# Patient Record
Sex: Male | Born: 1980 | Race: White | Hispanic: No | Marital: Married | Smoking: Never smoker
Health system: Southern US, Community
[De-identification: ages and names within clinical notes are randomized; demographics above are authoritative.]

## PROBLEM LIST (undated history)

## (undated) DIAGNOSIS — E119 Type 2 diabetes mellitus without complications: Secondary | ICD-10-CM

---

## 2008-05-08 ENCOUNTER — Encounter: Admission: RE | Admit: 2008-05-08 | Discharge: 2008-05-08 | Payer: Self-pay | Admitting: Unknown Physician Specialty

## 2008-05-09 ENCOUNTER — Emergency Department (HOSPITAL_COMMUNITY): Admission: EM | Admit: 2008-05-09 | Discharge: 2008-05-09 | Payer: Self-pay | Admitting: Emergency Medicine

## 2009-12-24 IMAGING — CT CT PELVIS W/ CM
2 of 5 series · 17 of 46 positions shown, 19 images · IV contrast (READICAT/WATER & OMNI 300/[ID])
Comparison: None

CT ABDOMEN

CLINICAL DATA: Left upper quadrant pain for several months, nausea
and diarrhea

CT ABDOMEN AND PELVIS WITH CONTRAST
TECHNIQUE: Multidetector CT imaging of the abdomen and pelvis was
performed using the standard protocol following bolus
administration of intravenous contrast.
Contrast: 100 ml of Vmnipaque-RAA

[Series 2: abdomen w/ · axial · 0.70mm/px · z∈[-471,-106]mm · 14 of 83 slices shown, 16 images]
[im 5/83  soft-tissue]
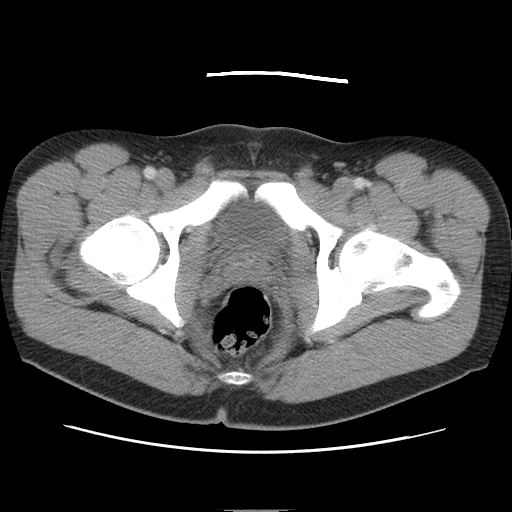
[im 5/83  bone]
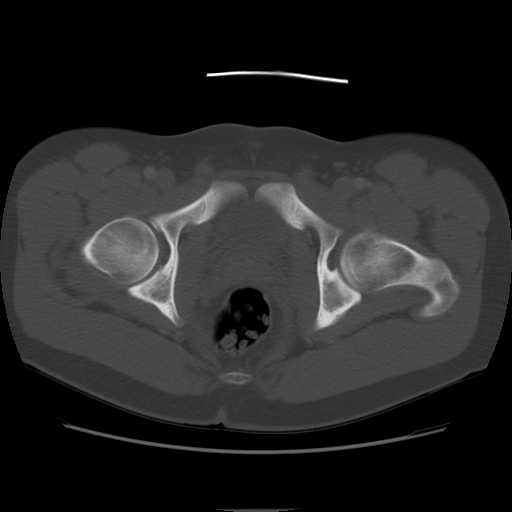
[im 10/83  soft-tissue]
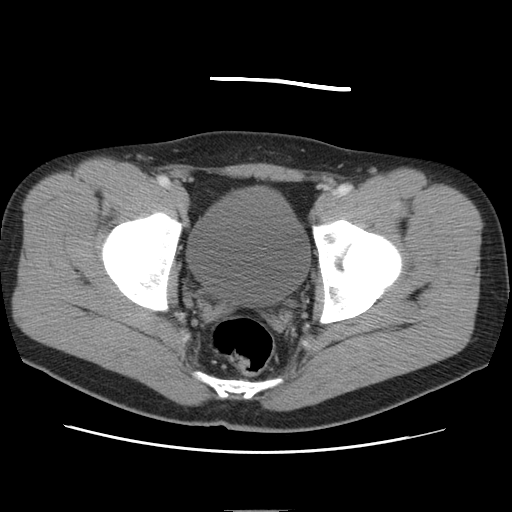
[im 19/83  soft-tissue]
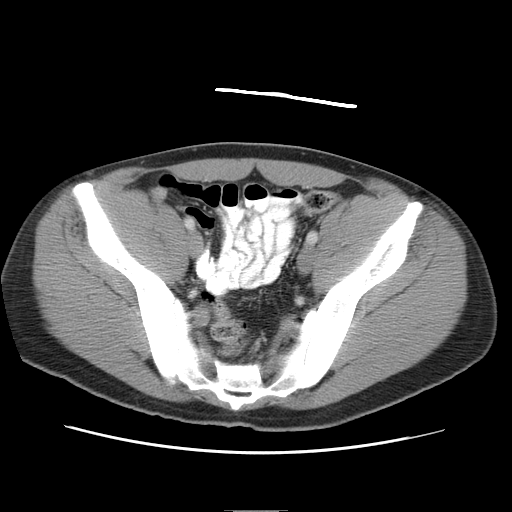
[im 23/83  soft-tissue]
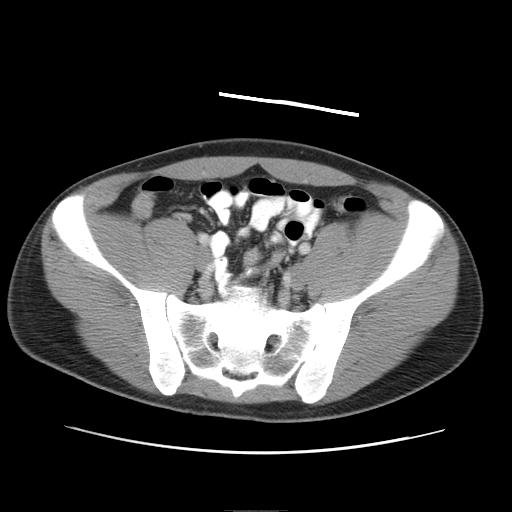
[im 28/83  soft-tissue]
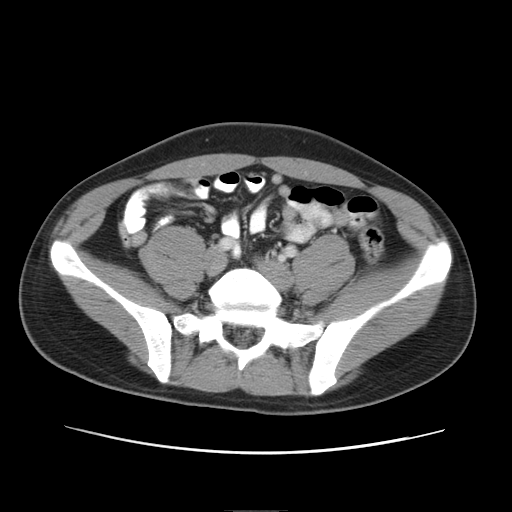
[im 32/83  soft-tissue]
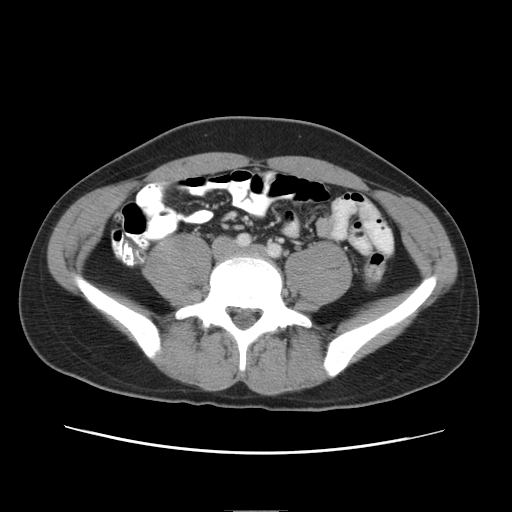
[im 37/83  soft-tissue]
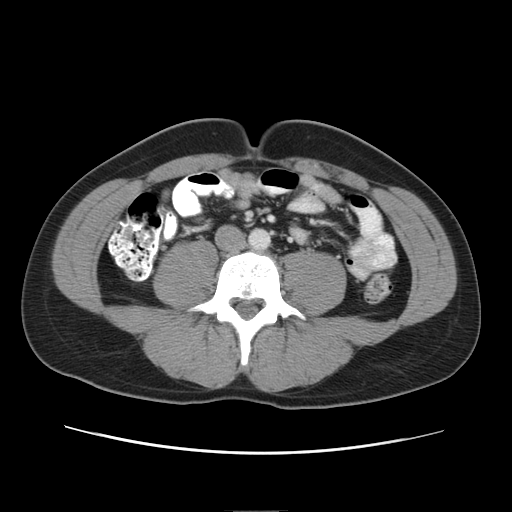
[im 46/83  soft-tissue]
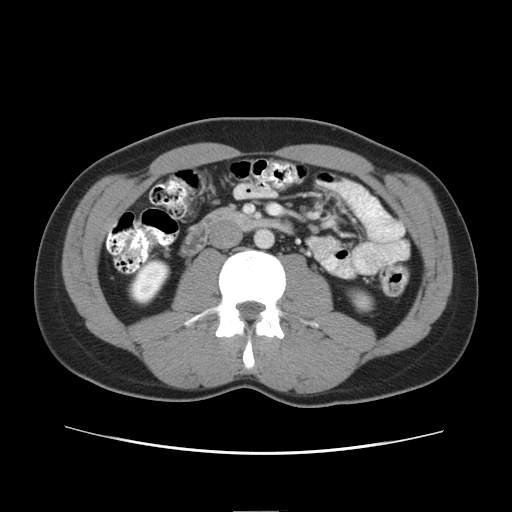
[im 51/83  soft-tissue]
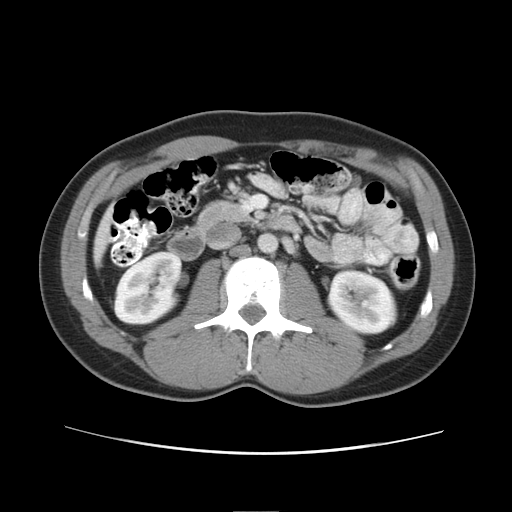
[im 51/83  bone]
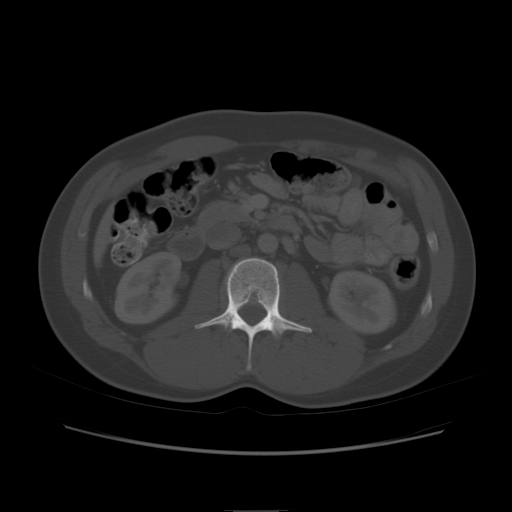
[im 55/83  soft-tissue]
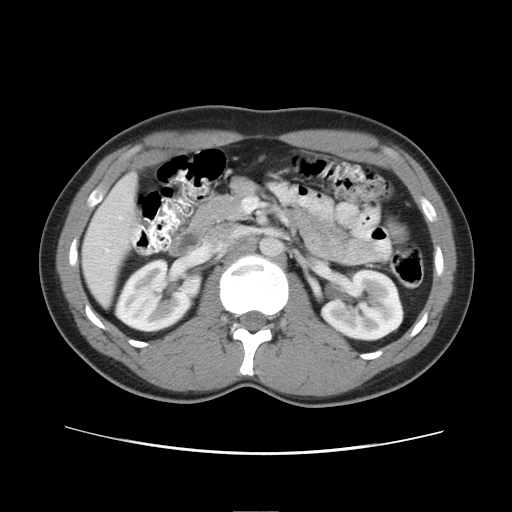
[im 60/83  soft-tissue]
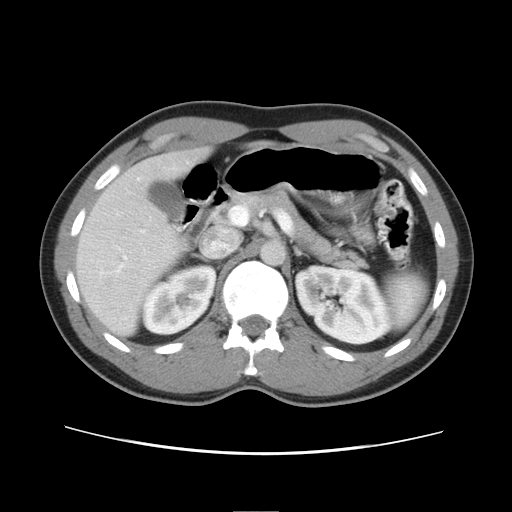
[im 64/83  soft-tissue]
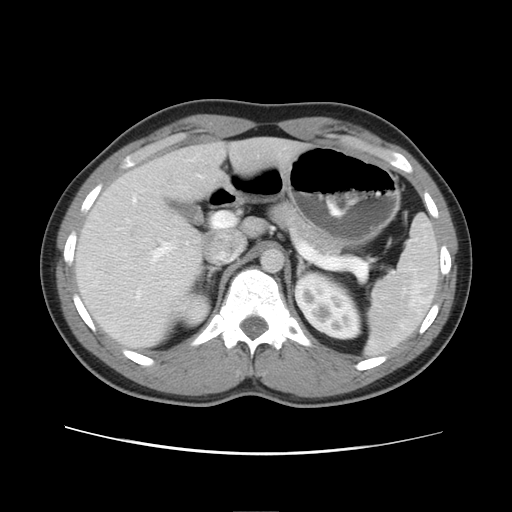
[im 73/83  soft-tissue]
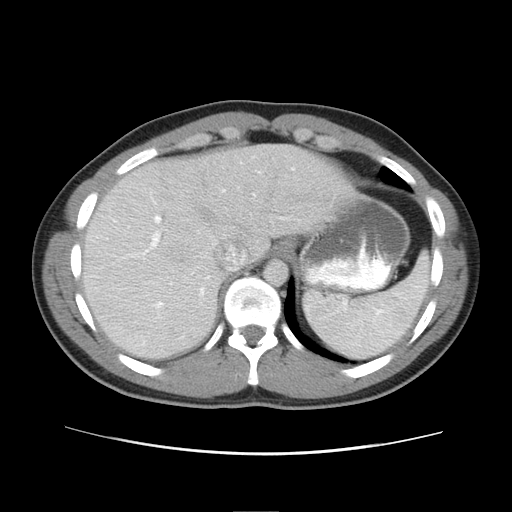
[im 78/83  soft-tissue]
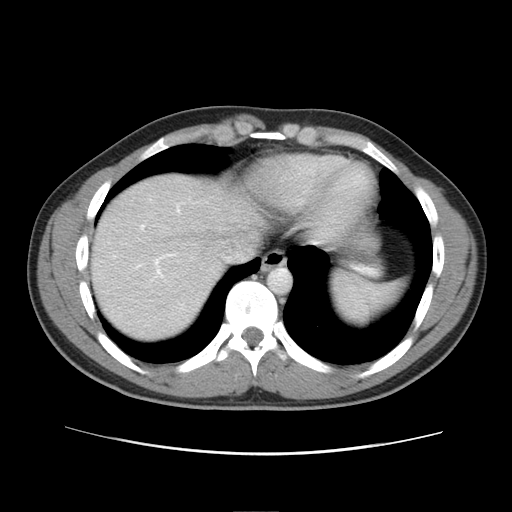

[Series 401: cor · coronal · 0.91mm/px · 3 of 102 slices shown]
[im 34/102  soft-tissue]
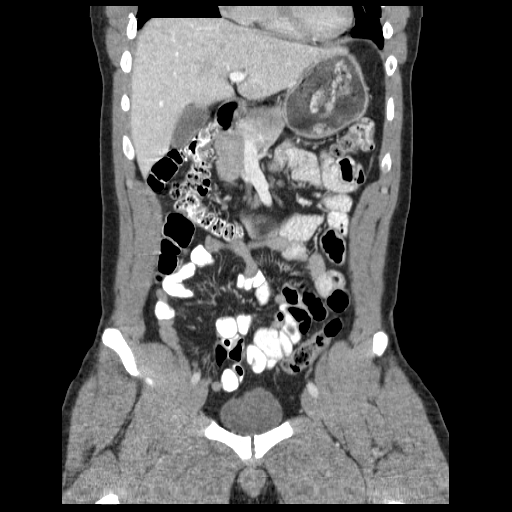
[im 45/102  soft-tissue]
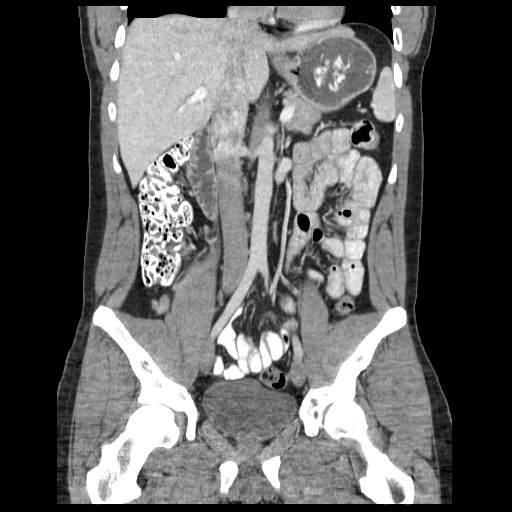
[im 57/102  soft-tissue]
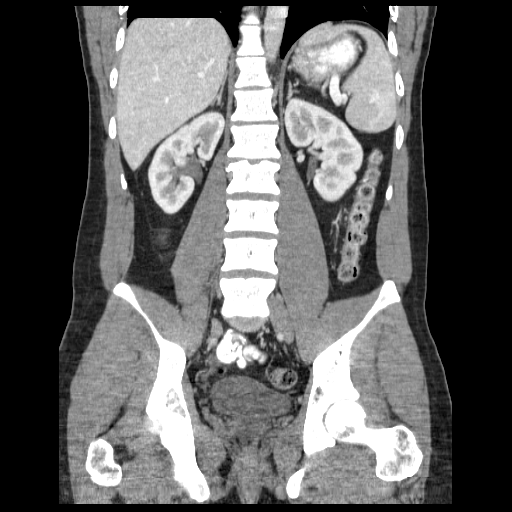

[17 of 46 positions shown; findings below may reference images not displayed]

FINDINGS: The lung bases are clear.  The liver enhances with no
focal abnormality and no ductal dilatation is seen.  No calcified
gallstones are noted.  The pancreas is normal in size and the
pancreatic duct is not dilated.  The adrenal glands and spleen
appear normal.  The kidneys enhance and on delayed images the
pelvocaliceal systems appear normal.  The abdominal aorta is normal
in caliber.  There are is somewhat prominent nodes within the right
lower quadrant, with one of the larger nodes measuring 9 x 11 mm.
Mesenteric adenitis is a consideration.
IMPRESSION: Somewhat prominent nodes in the right lower quadrant may indicate
mesenteric adenitis.

CT PELVIS
FINDINGS: The appendix is well seen and appears normal.  There is
no CT evidence of appendicitis.  The terminal ileum is well
visualized and appears normal.  The urinary bladder is
unremarkable.  Prostate is normal in size.  No pelvic mass or
adenopathy is seen within the pelvis.  No bony abnormalities noted.
IMPRESSION: The appendix and terminal ileum appear normal.

## 2013-04-21 ENCOUNTER — Encounter (HOSPITAL_BASED_OUTPATIENT_CLINIC_OR_DEPARTMENT_OTHER): Payer: Self-pay | Admitting: Emergency Medicine

## 2013-04-21 ENCOUNTER — Emergency Department (HOSPITAL_BASED_OUTPATIENT_CLINIC_OR_DEPARTMENT_OTHER)
Admission: EM | Admit: 2013-04-21 | Discharge: 2013-04-22 | Disposition: A | Discharge status: Home / Self Care | Payer: BC Managed Care – PPO | Attending: Emergency Medicine | Admitting: Emergency Medicine

## 2013-04-21 DIAGNOSIS — Z79899 Other long term (current) drug therapy: Secondary | ICD-10-CM | POA: Insufficient documentation

## 2013-04-21 DIAGNOSIS — R634 Abnormal weight loss: Secondary | ICD-10-CM | POA: Insufficient documentation

## 2013-04-21 DIAGNOSIS — R739 Hyperglycemia, unspecified: Secondary | ICD-10-CM

## 2013-04-21 DIAGNOSIS — E119 Type 2 diabetes mellitus without complications: Secondary | ICD-10-CM | POA: Insufficient documentation

## 2013-04-21 DIAGNOSIS — R110 Nausea: Secondary | ICD-10-CM | POA: Insufficient documentation

## 2013-04-21 HISTORY — DX: Type 2 diabetes mellitus without complications: E11.9

## 2013-04-21 LAB — URINALYSIS, ROUTINE W REFLEX MICROSCOPIC
Bilirubin Urine: NEGATIVE
Glucose, UA: >1000 mg/dL — AB
Hgb urine dipstick: NEGATIVE
Ketones, ur: 40 mg/dL — AB
Leukocytes, UA: NEGATIVE
Nitrite: NEGATIVE
Protein, ur: NEGATIVE mg/dL
Specific Gravity, Urine: 1.044 — ABNORMAL HIGH (ref 1.005–1.030)
Urobilinogen, UA: 0.2 mg/dL (ref 0.0–1.0)
pH: 5.5 (ref 5.0–8.0)

## 2013-04-21 LAB — GLUCOSE, CAPILLARY
Glucose-Capillary: 596 mg/dL (ref 70–99)
Glucose-Capillary: 562 mg/dL (ref 70–99)
Glucose-Capillary: 367 mg/dL — ABNORMAL HIGH (ref 70–99)

## 2013-04-21 LAB — CBC WITH DIFFERENTIAL/PLATELET
Basophils Absolute: 0 10*3/uL (ref 0.0–0.1)
Basophils Relative: 0 % (ref 0–1)
Eosinophils Absolute: 0.2 10*3/uL (ref 0.0–0.7)
Eosinophils Relative: 3 % (ref 0–5)
HCT: 46.4 % (ref 39.0–52.0)
Hemoglobin: 16.4 g/dL (ref 13.0–17.0)
Lymphocytes Relative: 19 % (ref 12–46)
Lymphs Abs: 1.5 10*3/uL (ref 0.7–4.0)
MCH: 31.8 pg (ref 26.0–34.0)
MCHC: 35.3 g/dL (ref 30.0–36.0)
MCV: 90.1 fL (ref 78.0–100.0)
Monocytes Absolute: 0.8 10*3/uL (ref 0.1–1.0)
Monocytes Relative: 10 % (ref 3–12)
Neutro Abs: 5.3 10*3/uL (ref 1.7–7.7)
Neutrophils Relative %: 68 % (ref 43–77)
Platelets: 177 10*3/uL (ref 150–400)
RBC: 5.15 MIL/uL (ref 4.22–5.81)
RDW: 12.1 % (ref 11.5–15.5)
WBC: 7.8 10*3/uL (ref 4.0–10.5)

## 2013-04-21 LAB — COMPREHENSIVE METABOLIC PANEL
ALT: 22 U/L (ref 0–53)
AST: 21 U/L (ref 0–37)
Albumin: 5.2 g/dL (ref 3.5–5.2)
Alkaline Phosphatase: 124 U/L — ABNORMAL HIGH (ref 39–117)
BUN: 19 mg/dL (ref 6–23)
CO2: 24 mEq/L (ref 19–32)
Calcium: 9.8 mg/dL (ref 8.4–10.5)
Chloride: 89 mEq/L — ABNORMAL LOW (ref 96–112)
Creatinine, Ser: 0.9 mg/dL (ref 0.50–1.35)
GFR calc Af Amer: >90 mL/min (ref >90)
GFR calc non Af Amer: >90 mL/min (ref >90)
Glucose, Bld: 674 mg/dL (ref 70–99)
Potassium: 4.5 mEq/L (ref 3.7–5.3)
Sodium: 133 mEq/L — ABNORMAL LOW (ref 137–147)
Total Bilirubin: 1.1 mg/dL (ref 0.3–1.2)
Total Protein: 7.8 g/dL (ref 6.0–8.3)

## 2013-04-21 LAB — URINE MICROSCOPIC-ADD ON

## 2013-04-21 MED ORDER — DEXTROSE-NACL 5-0.45 % IV SOLN: INTRAVENOUS | Status: DC

## 2013-04-21 MED ORDER — SODIUM CHLORIDE 0.9 % IV SOLN: INTRAVENOUS | Status: DC

## 2013-04-21 MED ORDER — SODIUM CHLORIDE 0.9 % IV SOLN
INTRAVENOUS | Status: DC
  Administered 2013-04-21: 22:00:00 via INTRAVENOUS

## 2013-04-21 MED ORDER — INSULIN ASPART PROT & ASPART (70-30 MIX) 100 UNIT/ML ~~LOC~~ SUSP: 15.0000 [IU] | Freq: Once | SUBCUTANEOUS | Status: DC

## 2013-04-21 MED ORDER — DEXTROSE 50 % IV SOLN: 25.0000 mL | INTRAVENOUS | Status: DC | PRN

## 2013-04-21 MED ORDER — INSULIN ASPART 100 UNIT/ML ~~LOC~~ SOLN
15.0000 [IU] | Freq: Once | SUBCUTANEOUS | Status: AC
  Administered 2013-04-21: 15 [IU] via SUBCUTANEOUS

## 2013-04-21 MED ORDER — SODIUM CHLORIDE 0.9 % IV BOLUS (SEPSIS)
1000.0000 mL | Freq: Once | INTRAVENOUS | Status: AC
  Administered 2013-04-21: 1000 mL via INTRAVENOUS

## 2013-04-21 MED ORDER — INSULIN REGULAR BOLUS VIA INFUSION
0.0000 [IU] | Freq: Three times a day (TID) | INTRAVENOUS | Status: DC
Start: 2013-04-22 — End: 2013-04-21
  Filled 2013-04-21: qty 10

## 2013-04-21 MED ORDER — INSULIN ASPART 100 UNIT/ML ~~LOC~~ SOLN
15.0000 [IU] | Freq: Once | SUBCUTANEOUS | Status: DC
  Filled 2013-04-21: qty 1

## 2013-04-21 NOTE — ED Provider Notes (Signed)
CSN: [id number]     Arrival date & time 04/21/13  1954 History  This chart was scribed for [name], MD by [name], ED Scribe. This patient was seen in room MH08/MH08 and the patient's care was started 9:02 PM.   Chief Complaint  Patient presents with  . Hyperglycemia    The history is provided by the patient. No language interpreter was used.     HPI Comments: Kenneth Ellison is a 33 y.o. male who presents to the Emergency Department complaining of hyperglycemia that began 5 weeks ago. He reports being diagnosed with DM about 5 weeks ago and was started on Metformin 1000 mg at that time. He reports having blurry vision and polyuria prior to being diagnosed. He states the blurry vision resolved after starting the Metformin. Pt states he has been compliant with the medication and he has been checking his blood glucose levels every morning but the blood sugar levels have remained elevated. He reports having unexpected weight loss and he has lost about 30 lbs since being diagnosed. He reports having polydipsia and polyuria along with nausea from taking the medication.   PCP [facility] in [place] Past Medical History  Diagnosis Date  . Diabetes mellitus without complication    History reviewed. No pertinent past surgical history. No family history on file. History  Substance Use Topics  . Smoking status: Never Smoker   . Smokeless tobacco: Former Neurosurgeon  . Alcohol Use: No    Review of Systems  Constitutional: Positive for unexpected weight change.  HENT: Negative.   Respiratory: Negative.   Cardiovascular: Negative.   Gastrointestinal: Positive for nausea.  Endocrine: Positive for polydipsia and polyuria.  Musculoskeletal: Negative.   Skin: Negative.   Neurological: Negative.   Psychiatric/Behavioral: Negative.     Allergies  Sulfa antibiotics  Home Medications   Current Outpatient Rx  Name  Route  Sig  Dispense  Refill  . metFORMIN (GLUCOPHAGE) 1000 MG  tablet   Oral   Take 500 mg by mouth 2 (two) times daily with a meal.          BP 148/85  Pulse 109  Temp(Src) 97.9 F (36.6 C) (Oral)  Resp 18  SpO2 100% Physical Exam  Nursing note and vitals reviewed. Constitutional: He appears well-developed and well-nourished.  HENT:  Head: Normocephalic and atraumatic.  Eyes: Conjunctivae are normal. Pupils are equal, round, and reactive to light.  Neck: Neck supple. No tracheal deviation present. No thyromegaly present.  Cardiovascular: Normal rate and regular rhythm.   No murmur heard. Pulmonary/Chest: Effort normal and breath sounds normal.  Abdominal: Soft. Bowel sounds are normal. He exhibits no distension. There is no tenderness.  Musculoskeletal: Normal range of motion. He exhibits no edema and no tenderness.  Neurological: He is alert. Coordination normal.  Skin: Skin is warm and dry. No rash noted.  Psychiatric: He has a normal mood and affect.    ED Course  Procedures (including critical care time)  DIAGNOSTIC STUDIES: Oxygen Saturation is 100% on RA, normal by my interpretation.    COORDINATION OF CARE: 9:26 PM Discussed treatment plan with pt at bedside and pt agreed to plan.   Labs Review Labs Reviewed  GLUCOSE, CAPILLARY - Abnormal; Notable for the following:    Glucose-Capillary 596 (*)    All other components within normal limits  URINALYSIS, ROUTINE W REFLEX MICROSCOPIC - Abnormal; Notable for the following:    Specific Gravity, Urine 1.044 (*)    Glucose, UA >1000 (*)  Ketones, ur 40 (*)    All other components within normal limits  CBC WITH DIFFERENTIAL  URINE MICROSCOPIC-ADD ON  COMPREHENSIVE METABOLIC PANEL   Imaging Review No results found.  EKG Interpretation   None        Results for orders placed during the hospital encounter of 04/21/13  GLUCOSE, CAPILLARY      Result Value Range   Glucose-Capillary 596 (*) 70 - 99 mg/dL   Comment 1 Notify RN    URINALYSIS, ROUTINE W REFLEX  MICROSCOPIC      Result Value Range   Color, Urine YELLOW  YELLOW   APPearance CLEAR  CLEAR   Specific Gravity, Urine 1.044 (*) 1.005 - 1.030   pH 5.5  5.0 - 8.0   Glucose, UA >1000 (*) NEGATIVE mg/dL   Hgb urine dipstick NEGATIVE  NEGATIVE   Bilirubin Urine NEGATIVE  NEGATIVE   Ketones, ur 40 (*) NEGATIVE mg/dL   Protein, ur NEGATIVE  NEGATIVE mg/dL   Urobilinogen, UA 0.2  0.0 - 1.0 mg/dL   Nitrite NEGATIVE  NEGATIVE   Leukocytes, UA NEGATIVE  NEGATIVE  CBC WITH DIFFERENTIAL      Result Value Range   WBC 7.8  4.0 - 10.5 K/uL   RBC 5.15  4.22 - 5.81 MIL/uL   Hemoglobin 16.4  13.0 - 17.0 g/dL   HCT 91.4  78.2 - 95.6 %   MCV 90.1  78.0 - 100.0 fL   MCH 31.8  26.0 - 34.0 pg   MCHC 35.3  30.0 - 36.0 g/dL   RDW 21.3  08.6 - 57.8 %   Platelets 177  150 - 400 K/uL   Neutrophils Relative % 68  43 - 77 %   Neutro Abs 5.3  1.7 - 7.7 K/uL   Lymphocytes Relative 19  12 - 46 %   Lymphs Abs 1.5  0.7 - 4.0 K/uL   Monocytes Relative 10  3 - 12 %   Monocytes Absolute 0.8  0.1 - 1.0 K/uL   Eosinophils Relative 3  0 - 5 %   Eosinophils Absolute 0.2  0.0 - 0.7 K/uL   Basophils Relative 0  0 - 1 %   Basophils Absolute 0.0  0.0 - 0.1 K/uL  COMPREHENSIVE METABOLIC PANEL      Result Value Range   Sodium 133 (*) 137 - 147 mEq/L   Potassium 4.5  3.7 - 5.3 mEq/L   Chloride 89 (*) 96 - 112 mEq/L   CO2 24  19 - 32 mEq/L   Glucose, Bld 674 (*) 70 - 99 mg/dL   BUN 19  6 - 23 mg/dL   Creatinine, Ser 4.69  0.50 - 1.35 mg/dL   Calcium 9.8  8.4 - 62.9 mg/dL   Total Protein 7.8  6.0 - 8.3 g/dL   Albumin 5.2  3.5 - 5.2 g/dL   AST 21  0 - 37 U/L   ALT 22  0 - 53 U/L   Alkaline Phosphatase 124 (*) 39 - 117 U/L   Total Bilirubin 1.1  0.3 - 1.2 mg/dL   GFR calc non Af Amer >90  >90 mL/min   GFR calc Af Amer >90  >90 mL/min  URINE MICROSCOPIC-ADD ON      Result Value Range   Bacteria, UA RARE  RARE  GLUCOSE, CAPILLARY      Result Value Range   Glucose-Capillary 562 (*) 70 - 99 mg/dL   Comment 1  Notify RN    GLUCOSE, CAPILLARY  Result Value Range   Glucose-Capillary 367 (*) 70 - 99 mg/dL  GLUCOSE, CAPILLARY      Result Value Range   Glucose-Capillary 255 (*) 70 - 99 mg/dL   No results found.  12:25 AM feels improved after treatment with intravenous fluids and subcutaneous insulin. He feels very home MDM  No diagnosis found. I spoke with Dr.Swaim plan prescription Lantus insulin 10 units daily starting later today . Continue metformin . An appointment has been scheduled for him to see Dr.Sun on Monday , 04/23/2013. Diagnosis hyperglycemia      Doug SouSam Leora Platt, MD 04/22/13 [phone]

## 2013-04-21 NOTE — ED Notes (Signed)
Pt states his primary care doctor has not done a complete work up on his new diagnosis = pt unable to follow up with endocrinologist due to need for insurance referrals.

## 2013-04-21 NOTE — ED Notes (Signed)
Per lab Glucose 674-EDP notified.

## 2013-04-21 NOTE — ED Notes (Signed)
I gave urine sample to lab for holding until order prints.

## 2013-04-21 NOTE — ED Notes (Signed)
Vital obtained by EMT Onalee Huaavid

## 2013-04-21 NOTE — ED Notes (Signed)
Dx with diabetes 5 weeks ago and was started on metformin- states sugars have been elevated- CBG 596 in triage

## 2013-04-22 LAB — GLUCOSE, CAPILLARY: Glucose-Capillary: 255 mg/dL — ABNORMAL HIGH (ref 70–99)

## 2013-04-22 MED ORDER — INSULIN GLARGINE 100 UNIT/ML ~~LOC~~ SOLN: 10.0000 [IU] | Freq: Every day | SUBCUTANEOUS | Status: DC

## 2013-04-22 NOTE — Discharge Instructions (Signed)
High Blood Sugar An appointment has been scheduled for you to see Dr. Wynelle LinkSun Monday 04/23/13 at 1145 am.  Take the insulin at the same time each day. Continue to take metformin as prescribed. Check your blood sugar when you get home from here. If your blood sugar is less than 90, eat something sweet and recheck it after 30 minutes. If he can't get your blood sugar above 110, return here immediately  High blood sugar (hyperglycemia) means that the level of sugar in your blood is higher than it should be. Signs of high blood sugar include:  Feeling thirsty.  Frequent peeing (urinating).  Feeling tired or sleepy.  Dry mouth.  Vision changes.  Feeling weak.  Feeling hungry but losing weight.  Numbness and tingling in your hands or feet.  Headache. When you ignore these signs, your blood sugar may keep going up. These problems may get worse, and other problems may begin. HOME CARE  Check your blood sugars as told by your doctor. Write down the numbers with the date and time.  Take the right amount of insulin or diabetes pills at the right time. Write down the dose with date and time.  Refill your insulin or diabetes pills before running out.  Watch what you eat. Follow your meal plan.  Drink liquids without sugar, such as water. Check with your doctor if you have kidney or heart disease.  Follow your doctor's orders for exercise. Exercise at the same time of day.  Keep your doctor's appointments. GET HELP RIGHT AWAY IF:   You have trouble thinking or are confused.  You have fast breathing with fruity smelling breath.  You pass out (faint).  You have 2 to 3 days of high blood sugars and you do not know why.  You have chest pain.  You are feeling sick to your stomach (nauseous) or throwing up (vomiting).  You have sudden vision changes. MAKE SURE YOU:   Understand these instructions.  Will watch your condition.  Will get help right away if you are not doing well or get  worse. Document Released: 01/31/2009 Document Revised: 06/28/2011 Document Reviewed: 01/31/2009 [facility] Patient Information 2014 [place], [place].

## 2013-05-16 ENCOUNTER — Encounter: Payer: BC Managed Care – PPO | Attending: Family Medicine

## 2013-05-16 VITALS — Ht 67.0 in | Wt 144.2 lb

## 2013-05-16 DIAGNOSIS — Z713 Dietary counseling and surveillance: Secondary | ICD-10-CM | POA: Insufficient documentation

## 2013-05-16 DIAGNOSIS — E119 Type 2 diabetes mellitus without complications: Secondary | ICD-10-CM | POA: Insufficient documentation

## 2013-05-17 NOTE — Progress Notes (Signed)
Patient was seen on 05/17/2013 for the first of a series of three diabetes self-management courses at the [facility].  Current HbA1c: 9.7% on 03/19/13  The following learning objectives were met by the patient during this class:  Describe diabetes  State some common risk factors for diabetes  Defines the role of glucose and insulin  Identifies type of diabetes and pathophysiology  Describe the relationship between diabetes and cardiovascular risk  State the members of the Healthcare Team  States the rationale for glucose monitoring  State when to test glucose  State their individual Target Range  State the importance of logging glucose readings  Describe how to interpret glucose readings  Identifies A1C target  Explain the correlation between A1c and eAG values  State symptoms and treatment of high blood glucose  State symptoms and treatment of low blood glucose  Explain proper technique for glucose testing  Identifies proper sharps disposal  Handouts given during class include:  Living Well with Diabetes book  Carb Counting and Meal Planning book  Meal Plan Card  Carbohydrate guide  Meal planning worksheet  Low Sodium Flavoring Tips  The diabetes portion plate  P8G to eAG Conversion Chart  Diabetes Medications  Diabetes Recommended Care Schedule  Support Group  Diabetes Success Plan  Core Class Satisfaction Survey  Follow-Up Plan:  Attend core 2

## 2013-06-13 ENCOUNTER — Encounter: Payer: BC Managed Care – PPO | Attending: Family Medicine

## 2013-06-13 DIAGNOSIS — E119 Type 2 diabetes mellitus without complications: Secondary | ICD-10-CM | POA: Insufficient documentation

## 2013-06-13 DIAGNOSIS — Z713 Dietary counseling and surveillance: Secondary | ICD-10-CM | POA: Insufficient documentation

## 2013-06-13 NOTE — Progress Notes (Signed)
Patient was seen on 06/13/2013 for the second of a series of three diabetes self-management courses at the [facility]. The following learning objectives were met by the patient during this class:   Describe the role of different macronutrients on glucose  Explain how carbohydrates affect blood glucose  State what foods contain the most carbohydrates  Demonstrate carbohydrate counting  Demonstrate how to read Nutrition Facts food label  Describe effects of various fats on heart health  Describe the importance of good nutrition for health and healthy eating strategies  Describe techniques for managing your shopping, cooking and meal planning  List strategies to follow meal plan when dining out  Describe the effects of alcohol on glucose and how to use it safely  Goals:  Follow Diabetes Meal Plan as instructed  Eat 3 meals and 2 snacks, every 3-5 hrs  Limit carbohydrate intake to 60-75 grams carbohydrate/meal Limit carbohydrate intake to 15-30 grams carbohydrate/snack Add lean protein foods to meals/snacks  Monitor glucose levels as instructed by your doctor   Follow-Up Plan:  Attend Core 3  Work towards following your personal food plan.

## 2013-06-20 ENCOUNTER — Ambulatory Visit: Payer: BC Managed Care – PPO

## 2013-06-25 ENCOUNTER — Encounter: Payer: BC Managed Care – PPO | Attending: Family Medicine | Admitting: *Deleted

## 2013-06-25 ENCOUNTER — Encounter: Payer: Self-pay | Admitting: *Deleted

## 2013-06-25 VITALS — Ht 67.0 in | Wt 150.6 lb

## 2013-06-25 DIAGNOSIS — Z713 Dietary counseling and surveillance: Secondary | ICD-10-CM | POA: Insufficient documentation

## 2013-06-25 DIAGNOSIS — E119 Type 2 diabetes mellitus without complications: Secondary | ICD-10-CM | POA: Insufficient documentation

## 2013-06-25 DIAGNOSIS — IMO0002 Reserved for concepts with insufficient information to code with codable children: Secondary | ICD-10-CM

## 2013-06-25 DIAGNOSIS — E1065 Type 1 diabetes mellitus with hyperglycemia: Secondary | ICD-10-CM

## 2013-06-25 NOTE — Progress Notes (Signed)
Appt start time: 1045 end time:  1215.  Assessment:  Patient was seen on  06/25/13 for individual diabetes education. This patient was diagnosed with Type 2 Diabetes in December, 2014. He did not respond to Metformin and has since been diagnosed with Type 1 Diabetes, taking basal bolus insulin with Lantus and Humalog. He expresses excellent understanding of Carb Counting, factors that affect BG including food, activity, insulin and stress. He is also adjusting his bolus insulin based on Carb Ratio of 1/15 grams, and is using Correction Factor of 1/50 mg/dl above target of 161100. 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 He expresses understanding of signs, causes and treatment options for hypoglycemia. He states some interest in CGM and eventually perhaps an insulin pump. Would like more information to enable him to make informed choice about this.  Current HbA1c: 9.7%  Preferred Learning Style:   No preference indicated   Learning Readiness:   Ready  Change in progress  MEDICATIONS: see list. Diabetes medications are Lantus and Humalog  DIETARY INTAKE: not discussed at this visit. Patient attended Core 1 and Core 2 of Diabetes Classes and expresses very good understanding of nutrition recommendations for diabetes and Carb Counting.  Usual physical activity: physically active with his lawn business     Intervention:    Commended him on his excellent understanding of his diabetes and all of the management details.  Taught difference between delivery of insulin via syringe/pen compared to insulin pump.  Demonstrated improved insulin delivery via pump due to improved accuracy of dose and flexibility of adjusting bolus insulin based on carb intake and BG correction.  Demonstrated pump, insulin reservoir and infusion set options, and button pushing for bolus delivery of insulin through the pump  Explained importance of testing BG at least 4 times per day for appropriate  correction of high BG and prevention of DKA as applicable.  Provided him the opportunity to insert a Mio infusion set which he did with no problem  Teaching Method Utilized: Visual, Auditory and Hands on  Handouts given during visit include:  Intensive Insulin Handout  Intro to Pumping handout  Barriers to learning/adherence to lifestyle change: none  Diabetes self-care support plan:   [facility] Type 1 support group flyer provided  Demonstrated degree of understanding via:  Teach Back   Monitoring/Evaluation:  Dietary intake, exercise, SMBG, insulin adjustment, and body weight prn. Patient to call as needed in the future.

## 2015-10-14 DIAGNOSIS — Z794 Long term (current) use of insulin: Secondary | ICD-10-CM | POA: Diagnosis not present

## 2015-10-14 DIAGNOSIS — E109 Type 1 diabetes mellitus without complications: Secondary | ICD-10-CM | POA: Diagnosis not present

## 2016-01-28 DIAGNOSIS — E109 Type 1 diabetes mellitus without complications: Secondary | ICD-10-CM | POA: Diagnosis not present

## 2016-01-28 DIAGNOSIS — Z794 Long term (current) use of insulin: Secondary | ICD-10-CM | POA: Diagnosis not present

## 2016-02-25 DIAGNOSIS — E109 Type 1 diabetes mellitus without complications: Secondary | ICD-10-CM | POA: Diagnosis not present

## 2016-03-02 LAB — HM DIABETES EYE EXAM

## 2016-03-29 DIAGNOSIS — J019 Acute sinusitis, unspecified: Secondary | ICD-10-CM | POA: Diagnosis not present

## 2016-04-22 ENCOUNTER — Ambulatory Visit (INDEPENDENT_AMBULATORY_CARE_PROVIDER_SITE_OTHER): Payer: BLUE CROSS/BLUE SHIELD | Admitting: Podiatry

## 2016-04-22 ENCOUNTER — Encounter: Payer: Self-pay | Admitting: Podiatry

## 2016-04-22 ENCOUNTER — Ambulatory Visit (INDEPENDENT_AMBULATORY_CARE_PROVIDER_SITE_OTHER): Payer: BLUE CROSS/BLUE SHIELD

## 2016-04-22 VITALS — BP 116/78 | HR 82 | Resp 18

## 2016-04-22 DIAGNOSIS — M7672 Peroneal tendinitis, left leg: Secondary | ICD-10-CM | POA: Diagnosis not present

## 2016-04-22 DIAGNOSIS — M779 Enthesopathy, unspecified: Secondary | ICD-10-CM

## 2016-04-22 DIAGNOSIS — R52 Pain, unspecified: Secondary | ICD-10-CM

## 2016-04-22 MED ORDER — MELOXICAM 15 MG PO TABS: 15.0000 mg | ORAL_TABLET | Freq: Every day | ORAL | 2 refills | Status: AC

## 2016-04-22 NOTE — Progress Notes (Signed)
   Subjective:    Patient ID: Kenneth Ellison, male    DOB: [birth year], 36 y.o.   MRN: [id number]  HPI  36 year old male presents the office today for concerns of left foot and ankle pain which started last week. He states that he was in a parking lot and he stepped on a rock twisting his foot inward some. Since the knees have pain in the outside aspect of his foot. He states it has gotten somewhat better discontinue. He has had no recent treatment for this other than taking over-the-counter Aleve. He is able to angulate regular shoe and the pain is worse after prolonged ambulation. No numbness or tingling. He has no other complaints today.   Review of Systems  All other systems reviewed and are negative.      Objective:   Physical Exam General: AAO x3, NAD  Dermatological: Skin is warm, dry and supple bilateral. Nails x 10 are well manicured; remaining integument appears unremarkable at this time. There are no open sores, no preulcerative lesions, no rash or signs of infection present.  Vascular: Dorsalis Pedis artery and Posterior Tibial artery pedal pulses are 2/4 bilateral with immedate capillary fill time. There is no pain with calf compression, swelling, warmth, erythema.   Neruologic: Grossly intact via light touch bilateral. Vibratory intact via tuning fork bilateral. Protective threshold with Semmes Wienstein monofilament intact to all pedal sites bilateral. Patellar and Achilles deep tendon reflexes 2+ bilateral. No Babinski or clonus noted bilateral.   Musculoskeletal: Mild decrease in medial arch height upon weightbearing. There is tenderness gently on the lateral aspect of the foot on the sinus tarsi. There is no pain with subdural joint range of motion. There is also tenderness along the course the peroneal tendon just posterior to the lateral malleolus. There is no area pinpoint bony tenderness there is no pain vibratory sensation. There is no significant edema and there is no  erythema or increase in warmth. There is no other areas of tenderness bilaterally. Muscular strength 5/5 in all groups tested bilateral.  Gait: Unassisted, Nonantalgic.     Assessment & Plan:  36 year old male left subtalar joint capsulitis, peroneal tendinitis -Treatment options discussed including all alternatives, risks, and complications -Etiology of symptoms were discussed -X-rays were obtained and reviewed with the patient. No definitive evidence of acute fracture identified. -Prescribed mobic. Discussed side effects of the medication and directed to stop if any are to occur and call the office.  -Discussed steroid injection into the sinus tarsi and he wishes to proceed understanding risks and complications. Her sterile conditions a mixture of dexamethasone and local anesthetic was infiltrated into the subtalar joint and the area of maximal tenderness without complications. Post injection care was discussed. -Dispensed Tri-Lock ankle brace -Ice. Limit activity. -Follow-up in 3 weeks or sooner if any problems arise. In the meantime, encouraged to call the office with any questions, concerns, change in symptoms.   Ovid CurdMatthew Wagoner, DPM

## 2016-05-13 ENCOUNTER — Ambulatory Visit: Payer: BLUE CROSS/BLUE SHIELD | Admitting: Podiatry

## 2016-06-17 DIAGNOSIS — E1065 Type 1 diabetes mellitus with hyperglycemia: Secondary | ICD-10-CM | POA: Diagnosis not present

## 2016-06-17 DIAGNOSIS — Z794 Long term (current) use of insulin: Secondary | ICD-10-CM | POA: Diagnosis not present

## 2016-09-27 DIAGNOSIS — Z794 Long term (current) use of insulin: Secondary | ICD-10-CM | POA: Diagnosis not present

## 2016-09-27 DIAGNOSIS — E1065 Type 1 diabetes mellitus with hyperglycemia: Secondary | ICD-10-CM | POA: Diagnosis not present

## 2016-11-05 DIAGNOSIS — H16141 Punctate keratitis, right eye: Secondary | ICD-10-CM | POA: Diagnosis not present

## 2017-01-31 DIAGNOSIS — E1065 Type 1 diabetes mellitus with hyperglycemia: Secondary | ICD-10-CM | POA: Diagnosis not present

## 2017-01-31 DIAGNOSIS — Z794 Long term (current) use of insulin: Secondary | ICD-10-CM | POA: Diagnosis not present

## 2017-03-26 DIAGNOSIS — R05 Cough: Secondary | ICD-10-CM | POA: Diagnosis not present

## 2017-03-26 DIAGNOSIS — J018 Other acute sinusitis: Secondary | ICD-10-CM | POA: Diagnosis not present

## 2017-03-31 ENCOUNTER — Emergency Department
Admission: EM | Admit: 2017-03-31 | Discharge: 2017-03-31 | Disposition: A | Discharge status: Home / Self Care | Payer: BLUE CROSS/BLUE SHIELD | Attending: Emergency Medicine | Admitting: Emergency Medicine

## 2017-03-31 DIAGNOSIS — E109 Type 1 diabetes mellitus without complications: Secondary | ICD-10-CM | POA: Insufficient documentation

## 2017-03-31 DIAGNOSIS — Y929 Unspecified place or not applicable: Secondary | ICD-10-CM | POA: Diagnosis not present

## 2017-03-31 DIAGNOSIS — R109 Unspecified abdominal pain: Secondary | ICD-10-CM

## 2017-03-31 DIAGNOSIS — Z794 Long term (current) use of insulin: Secondary | ICD-10-CM | POA: Diagnosis not present

## 2017-03-31 DIAGNOSIS — Y999 Unspecified external cause status: Secondary | ICD-10-CM | POA: Insufficient documentation

## 2017-03-31 DIAGNOSIS — Y939 Activity, unspecified: Secondary | ICD-10-CM | POA: Diagnosis not present

## 2017-03-31 DIAGNOSIS — S39012A Strain of muscle, fascia and tendon of lower back, initial encounter: Secondary | ICD-10-CM | POA: Diagnosis not present

## 2017-03-31 DIAGNOSIS — X58XXXA Exposure to other specified factors, initial encounter: Secondary | ICD-10-CM | POA: Insufficient documentation

## 2017-03-31 DIAGNOSIS — R1031 Right lower quadrant pain: Secondary | ICD-10-CM | POA: Diagnosis not present

## 2017-03-31 LAB — URINALYSIS, COMPLETE (UACMP) WITH MICROSCOPIC
Bacteria, UA: NONE SEEN
Bilirubin Urine: NEGATIVE
Glucose, UA: >=500 mg/dL — AB
Hgb urine dipstick: NEGATIVE
Ketones, ur: NEGATIVE mg/dL
Leukocytes, UA: NEGATIVE
Nitrite: NEGATIVE
Protein, ur: NEGATIVE mg/dL
Specific Gravity, Urine: 1.037 — ABNORMAL HIGH (ref 1.005–1.030)
Squamous Epithelial / LPF: NONE SEEN
pH: 6 (ref 5.0–8.0)

## 2017-03-31 LAB — BASIC METABOLIC PANEL
Anion gap: 8 (ref 5–15)
BUN: 15 mg/dL (ref 6–20)
CO2: 29 mmol/L (ref 22–32)
Calcium: 9.7 mg/dL (ref 8.9–10.3)
Chloride: 99 mmol/L — ABNORMAL LOW (ref 101–111)
Creatinine, Ser: 0.95 mg/dL (ref 0.61–1.24)
GFR calc Af Amer: >60 mL/min (ref >60)
GFR calc non Af Amer: >60 mL/min (ref >60)
Glucose, Bld: 247 mg/dL — ABNORMAL HIGH (ref 65–99)
Potassium: 4.1 mmol/L (ref 3.5–5.1)
Sodium: 136 mmol/L (ref 135–145)

## 2017-03-31 LAB — CBC
HCT: 44.4 % (ref 40.0–52.0)
Hemoglobin: 15.6 g/dL (ref 13.0–18.0)
MCH: 32 pg (ref 26.0–34.0)
MCHC: 35.1 g/dL (ref 32.0–36.0)
MCV: 91.4 fL (ref 80.0–100.0)
Platelets: 205 10*3/uL (ref 150–440)
RBC: 4.86 MIL/uL (ref 4.40–5.90)
RDW: 12.6 % (ref 11.5–14.5)
WBC: 8 10*3/uL (ref 3.8–10.6)

## 2017-03-31 MED ORDER — KETOROLAC TROMETHAMINE 60 MG/2ML IM SOLN
INTRAMUSCULAR | Status: AC
  Administered 2017-03-31: 60 mg via INTRAMUSCULAR
  Filled 2017-03-31: qty 2

## 2017-03-31 MED ORDER — CYCLOBENZAPRINE HCL 10 MG PO TABS
10.0000 mg | ORAL_TABLET | Freq: Once | ORAL | Status: AC
  Administered 2017-03-31: 10 mg via ORAL

## 2017-03-31 MED ORDER — KETOROLAC TROMETHAMINE 60 MG/2ML IM SOLN
60.0000 mg | Freq: Once | INTRAMUSCULAR | Status: AC
  Administered 2017-03-31: 60 mg via INTRAMUSCULAR

## 2017-03-31 MED ORDER — CYCLOBENZAPRINE HCL 10 MG PO TABS: 10.0000 mg | ORAL_TABLET | Freq: Three times a day (TID) | ORAL | 0 refills | Status: DC | PRN

## 2017-03-31 MED ORDER — CYCLOBENZAPRINE HCL 10 MG PO TABS
ORAL_TABLET | ORAL | Status: AC
  Administered 2017-03-31: 10 mg via ORAL
  Filled 2017-03-31: qty 1

## 2017-03-31 MED ORDER — KETOROLAC TROMETHAMINE 10 MG PO TABS: 10.0000 mg | ORAL_TABLET | Freq: Four times a day (QID) | ORAL | 0 refills | Status: DC | PRN

## 2017-03-31 MED ORDER — PREDNISONE 20 MG PO TABS
40.0000 mg | ORAL_TABLET | Freq: Every day | ORAL | 0 refills | Status: DC
Start: 2017-03-31 — End: 2018-01-03

## 2017-03-31 NOTE — ED Triage Notes (Signed)
Patient c/o right flank pain, rated at 10 of 10, described as sharp.  Patient denies urinary changes, N/V, hx of kidney stones/issue, lifting of heavy objects.

## 2017-03-31 NOTE — ED Provider Notes (Signed)
[facility] Emergency Department Provider Note  Time seen: 9:06 PM  I have reviewed the triage vital signs and the nursing notes.   HISTORY  Chief Complaint Flank Pain (right)    HPI Con MemosJeremy Hochberg is a 36 y.o. male with a past medical history of diabetes who presents to the emergency department with right back pain.  According to the patient around 6 PM tonight he bent over and felt a sudden sharp pain in his right lower back.  States he stood up and the pain worsened significantly.  States the pain radiates into his right buttocks.  States a history of intermittent back pain but nothing similar to this degree of back pain.  Patient works as a Administratorlandscaper.  Denies any sharp pains into the right leg besides a dull pain in his right buttocks.  He denies any dysuria or hematuria.  Denies vomiting.  Denies abdominal pain or fever.  Patient states he has had back pain before but typically goes away after a couple hours.  The pain was not relieving so the patient came to the emergency department for evaluation.   Past Medical History:  Diagnosis Date  . Diabetes mellitus without complication (HCC)     There are no active problems to display for this patient.   History reviewed. No pertinent surgical history.  Prior to Admission medications   Medication Sig Start Date End Date Taking? Authorizing Provider  insulin glargine (LANTUS) 100 UNIT/ML injection Inject 0.1 mLs (10 Units total) into the skin daily. 04/22/13   Doug SouJacubowitz, Sam, MD  insulin lispro (HUMALOG) 100 UNIT/ML injection Inject into the skin 3 (three) times daily with meals.    [provider]  meloxicam (MOBIC) 15 MG tablet Take 1 tablet (15 mg total) by mouth daily. 04/22/16 04/22/17  Vivi BarrackWagoner, Matthew R, DPM  metFORMIN (GLUCOPHAGE) 1000 MG tablet Take 500 mg by mouth 2 (two) times daily with a meal.    [provider]    Allergies  Allergen Reactions  . Sulfa Antibiotics     Family  History  Problem Relation Age of Onset  . Lupus Mother     Social History Social History   Tobacco Use  . Smoking status: Never Smoker  . Smokeless tobacco: Former Engineer, waterUser  Substance Use Topics  . Alcohol use: No  . Drug use: No    Review of Systems Constitutional: Negative for fever. Cardiovascular: Negative for chest pain. Respiratory: Negative for shortness of breath. Gastrointestinal: Negative for abdominal pain Genitourinary: Negative for dysuria.  Negative for hematuria.  Negative for kidney stone history. Musculoskeletal: Significant right back pain. Neurological: Negative for headache All other ROS negative  ____________________________________________   PHYSICAL EXAM:  VITAL SIGNS: ED Triage Vitals [03/31/17 1919]  Enc Vitals Group     BP 126/71     Pulse Rate 72     Resp 16     Temp 97.7 F (36.5 C)     Temp Source Oral     SpO2 100 %     Weight 150 lb (68 kg)     Height      Head Circumference      Peak Flow      Pain Score 10     Pain Loc      Pain Edu?      Excl. in GC?    Constitutional: Alert and oriented. Well appearing, lying on his side in bed, states minimal discomfort as long as he does not move. Eyes:  Normal exam ENT   Head: Normocephalic and atraumatic   Mouth/Throat: Mucous membranes are moist. Cardiovascular: Normal rate, regular rhythm. No murmur Respiratory: Normal respiratory effort without tachypnea nor retractions. Breath sounds are clear Gastrointestinal: Soft and nontender. No distention.   Musculoskeletal: Moderate right-sided lumbar paraspinal tenderness to palpation.  No midline tenderness.  Moderate right SI joint tenderness to palpation. Neurologic:  Normal speech and language. No gross focal neurologic deficits Skin:  Skin is warm, dry and intact.  Psychiatric: Mood and affect are normal.   ____________________________________________   INITIAL IMPRESSION / ASSESSMENT AND PLAN / ED COURSE  Pertinent labs &  imaging results that were available during my care of the patient were reviewed by me and considered in my medical decision making (see chart for details).  Patient presents to the emergency department for right-sided lower back pain.  Differential would include musculoskeletal pain, lumbar strain, radicular/sciatica pain, ureterolithiasis, intra-abdominal pathology.  Reassuringly the patient has a completely nontender abdominal exam he is tender over the paraspinal muscles and SI joint with some radiation into his right buttocks.  Symptoms started after bending over and standing upright.  Highly suspect muscular skeletal pain however given the patient's amount of discomfort especially with movement I offered to perform a CT scan, he states he would like to hold off at this time.  Patient's labs are largely within normal limits besides an elevated blood glucose.  The patient is a type I diabetic.  Urinalysis is negative for ketones, no anion gap.  Patient states he would like to hold off on CT scan for now, we will treat with Flexeril and Toradol in the emergency department and monitor for symptom improvement.  Patient agreeable to this plan of care.  The patient states good pain relief after Toradol and Flexeril.  We will discharge with the same.  I discussed with the patient that if the pain is not improving or he develops shooting pains down his legs he is to fill the prescription of prednisone otherwise he may hold off on filling the prednisone as it will affect his blood sugars.  Patient is aware of this. ____________________________________________   FINAL CLINICAL IMPRESSION(S) / ED DIAGNOSES  Musculoskeletal pain Lumbar strain    Minna AntisPaduchowski, Aysiah Jurado, MD 03/31/17 2225

## 2017-07-04 DIAGNOSIS — E1065 Type 1 diabetes mellitus with hyperglycemia: Secondary | ICD-10-CM | POA: Diagnosis not present

## 2017-07-04 DIAGNOSIS — Z794 Long term (current) use of insulin: Secondary | ICD-10-CM | POA: Diagnosis not present

## 2018-01-03 ENCOUNTER — Encounter (INDEPENDENT_AMBULATORY_CARE_PROVIDER_SITE_OTHER): Payer: Self-pay

## 2018-01-03 ENCOUNTER — Ambulatory Visit (INDEPENDENT_AMBULATORY_CARE_PROVIDER_SITE_OTHER): Payer: BLUE CROSS/BLUE SHIELD | Admitting: Primary Care

## 2018-01-03 ENCOUNTER — Encounter: Payer: Self-pay | Admitting: Primary Care

## 2018-01-03 ENCOUNTER — Ambulatory Visit (INDEPENDENT_AMBULATORY_CARE_PROVIDER_SITE_OTHER)
Admission: RE | Admit: 2018-01-03 | Discharge: 2018-01-03 | Disposition: A | Discharge status: Home / Self Care | Payer: BLUE CROSS/BLUE SHIELD | Source: Ambulatory Visit | Attending: Primary Care | Admitting: Primary Care

## 2018-01-03 VITALS — BP 118/74 | HR 81 | Temp 97.8°F | Ht 66.0 in | Wt 138.0 lb

## 2018-01-03 DIAGNOSIS — R1011 Right upper quadrant pain: Secondary | ICD-10-CM | POA: Diagnosis not present

## 2018-01-03 DIAGNOSIS — M79672 Pain in left foot: Secondary | ICD-10-CM

## 2018-01-03 DIAGNOSIS — E1065 Type 1 diabetes mellitus with hyperglycemia: Secondary | ICD-10-CM | POA: Insufficient documentation

## 2018-01-03 DIAGNOSIS — E109 Type 1 diabetes mellitus without complications: Secondary | ICD-10-CM | POA: Diagnosis not present

## 2018-01-03 HISTORY — DX: Pain in left foot: M79.672

## 2018-01-03 HISTORY — DX: Right upper quadrant pain: R10.11

## 2018-01-03 NOTE — Patient Instructions (Addendum)
Stop by the lab and xray prior to leaving today. I will notify you of your results once received.   You will be contacted regarding your ultrasound.  Please let us know if you have not been contacted within one week. I'll be in touch once I receive results.  Please follow up with your endocrinologist as scheduled.  It was a pleasure to meet you today! Please don't hesitate to call or message me with any questions. Welcome to [facility]!

## 2018-01-03 NOTE — Assessment & Plan Note (Signed)
No symptoms since August 25th. Could be gall bladder involvement vs MSK.  Check CMP today. Abdominal ultrasound ordered and pending. Abdominal exam today unremarkable.

## 2018-01-03 NOTE — Assessment & Plan Note (Signed)
Following with Dr. Sharl MaKerr through [facility]. Compliant to regimen. Continue current regimen.

## 2018-01-03 NOTE — Assessment & Plan Note (Signed)
No obvious abnormality on exam.  Check plain films to rule out heel spur.  Could be secondary to neuropathy.

## 2018-01-03 NOTE — Progress Notes (Signed)
Subjective:    Patient ID: Kenneth Ellison, male    DOB: [birth year], 37 y.o.   MRN: [id number]  HPI  Mr. Kenneth Ellison is a 37 year old male who presents today to establish care and discuss the problems mentioned below. Will obtain old records.  1) Type 1 Diabetes: Diagnosed at age 37. Currently managed on insulin degludec 24 units every morning and Novolog 3-4 units on average TID based off of carb consumption. He is following with Dr. Sharl MaKerr with [facility].   He checks his glucose levels three times daily. AM fasting: 120's, sometimes 50's-60's Before lunch: 150-160's Before dinner: 80's  2) Abdominal Pain: August 25th experienced a sudden onset of right upper quadrant pain with shortness of breath that felt like a "stabbing", deep spasm/cramp pain". He fell asleep and then the pain returned 30 minutes later that woke him from sleep. He then had a sudden resolve of pain within 30-45 minutes which hasn't returned since. He was on his way to the hospital but didn't go as his pain resolved completely.   He did hang tree stands that day prior to his symptoms. He denies eating a heavy/fatty meal prior. He has a history of gastric ulcers from stress years ago that were diagnosed on endoscopy. He also underwent colonoscopy which was unremarkable. Does occasionally experience intermittent symptoms of belching, esophageal burning. He does mention noticing a dull RUQ pain earlier this summer, was noticing some constipation with his discomfort which is odd as he's regular daily with bowel movements.   3) Heel Pain: Located to left heel for which he notices when waling barefoot. It feels like there's a nail driving through his foot. This has been present for the last one month. He has no symptoms when walking barefoot on carpet or when wearing shoes. He denies bumps, color changes.   Review of Systems  Constitutional: Negative for fever.  Eyes: Negative for visual disturbance.  Respiratory: Negative  for shortness of breath.   Cardiovascular: Negative for chest pain.  Gastrointestinal:       RUQ abdominal pain one month ago, see HPI  Endocrine: Negative for polyuria.  Musculoskeletal:       Left heel pain  Skin: Negative for color change.  Neurological: Negative for dizziness and headaches.  Psychiatric/Behavioral:       Intermittent stress with work       Past Medical History:  Diagnosis Date  . Diabetes mellitus without complication [facility](HCC)      Social History   Socioeconomic History  . Marital status: Married    Spouse name: Not on file  . Number of children: Not on file  . Years of education: Not on file  . Highest education level: Not on file  Occupational History  . Not on file  Social Needs  . Financial resource strain: Not on file  . Food insecurity:    Worry: Not on file    Inability: Not on file  . Transportation needs:    Medical: Not on file    Non-medical: Not on file  Tobacco Use  . Smoking status: Never Smoker  . Smokeless tobacco: Former Engineer, waterUser  Substance and Sexual Activity  . Alcohol use: No  . Drug use: No  . Sexual activity: Not on file  Lifestyle  . Physical activity:    Days per week: Not on file    Minutes per session: Not on file  . Stress: Not on file  Relationships  . Social connections:  Talks on phone: Not on file    Gets together: Not on file    Attends religious service: Not on file    Active member of club or organization: Not on file    Attends meetings of clubs or organizations: Not on file    Relationship status: Not on file  . Intimate partner violence:    Fear of current or ex partner: Not on file    Emotionally abused: Not on file    Physically abused: Not on file    Forced sexual activity: Not on file  Other Topics Concern  . Not on file  Social History Narrative   Married.   1 child.    Works as a Psychologist, sport and exercise, Aeronautical engineer.   Enjoys hunting, fishing.     History reviewed. No pertinent surgical  history.  Family History  Problem Relation Age of Onset  . Lupus Mother   . Fibromyalgia Mother   . Arthritis Mother   . Diabetes Mother   . Diabetes Father   . Lung cancer Maternal Grandfather   . Cancer Paternal Grandfather        Unknown    Allergies  Allergen Reactions  . Sulfa Antibiotics     Current Outpatient Medications on File Prior to Visit  Medication Sig Dispense Refill  . insulin aspart (NOVOLOG FLEXPEN) 100 UNIT/ML FlexPen INJECT 1UNIT /15-18 GR OF CARBS,/ MEAL MAY VARY AS PER BLOOD SUGAR LEVELS AND MEALS, MAX 17 U/DAY    . insulin degludec (TRESIBA FLEXTOUCH) 100 UNIT/ML SOPN FlexTouch Pen Inject 24 Units as directed every morning.     No current facility-administered medications on file prior to visit.     BP 118/74   Pulse 81   Temp 97.8 F (36.6 C) (Oral)   Ht 5\' 6"  (1.676 m)   Wt 138 lb (62.6 kg)   SpO2 97%   BMI 22.27 kg/m    Objective:   Physical Exam  Constitutional: He is oriented to person, place, and time. He appears well-nourished.  Neck: Neck supple.  Cardiovascular: Normal rate and regular rhythm.  Respiratory: Effort normal and breath sounds normal.  GI: Soft. Bowel sounds are normal. There is no tenderness.  Neurological: He is alert and oriented to person, place, and time.  Skin: Skin is warm and dry. No rash noted.  No obvious abnormality to left heel  Psychiatric: He has a normal mood and affect.           Assessment & Plan:

## 2018-01-04 LAB — COMPREHENSIVE METABOLIC PANEL
ALT: 18 U/L (ref 0–53)
AST: 20 U/L (ref 0–37)
Albumin: 5.2 g/dL (ref 3.5–5.2)
Alkaline Phosphatase: 74 U/L (ref 39–117)
BUN: 23 mg/dL (ref 6–23)
CO2: 31 mEq/L (ref 19–32)
Calcium: 10.3 mg/dL (ref 8.4–10.5)
Chloride: 100 mEq/L (ref 96–112)
Creatinine, Ser: 0.91 mg/dL (ref 0.40–1.50)
GFR: 100.79 mL/min (ref >60.00)
Glucose, Bld: 136 mg/dL — ABNORMAL HIGH (ref 70–99)
Potassium: 3.7 mEq/L (ref 3.5–5.1)
Sodium: 140 mEq/L (ref 135–145)
Total Bilirubin: 1.1 mg/dL (ref 0.2–1.2)
Total Protein: 7.8 g/dL (ref 6.0–8.3)

## 2018-01-10 ENCOUNTER — Ambulatory Visit
Admission: RE | Admit: 2018-01-10 | Discharge: 2018-01-10 | Disposition: A | Discharge status: Home / Self Care | Payer: BLUE CROSS/BLUE SHIELD | Source: Ambulatory Visit | Attending: Primary Care | Admitting: Primary Care

## 2018-01-10 DIAGNOSIS — R1011 Right upper quadrant pain: Secondary | ICD-10-CM | POA: Insufficient documentation

## 2018-01-23 ENCOUNTER — Telehealth: Payer: Self-pay | Admitting: Primary Care

## 2018-01-23 NOTE — Telephone Encounter (Signed)
Pt is requesting a copy of 9/17 x-ray. He will pick up at [facility] today or Tues afternoon.

## 2018-01-24 DIAGNOSIS — M79672 Pain in left foot: Secondary | ICD-10-CM | POA: Diagnosis not present

## 2018-01-24 DIAGNOSIS — E1065 Type 1 diabetes mellitus with hyperglycemia: Secondary | ICD-10-CM | POA: Diagnosis not present

## 2018-01-24 DIAGNOSIS — Z794 Long term (current) use of insulin: Secondary | ICD-10-CM | POA: Diagnosis not present

## 2018-02-27 ENCOUNTER — Encounter: Payer: Self-pay | Admitting: Podiatry

## 2018-02-27 ENCOUNTER — Ambulatory Visit: Payer: BLUE CROSS/BLUE SHIELD

## 2018-02-27 ENCOUNTER — Ambulatory Visit (INDEPENDENT_AMBULATORY_CARE_PROVIDER_SITE_OTHER): Payer: BLUE CROSS/BLUE SHIELD | Admitting: Podiatry

## 2018-02-27 DIAGNOSIS — M722 Plantar fascial fibromatosis: Secondary | ICD-10-CM | POA: Diagnosis not present

## 2018-02-27 MED ORDER — TRIAMCINOLONE ACETONIDE 10 MG/ML IJ SUSP
10.0000 mg | Freq: Once | INTRAMUSCULAR | Status: AC
Start: 2018-02-27 — End: 2018-02-27
  Administered 2018-02-27: 10 mg

## 2018-02-27 MED ORDER — MELOXICAM 15 MG PO TABS: 15.0000 mg | ORAL_TABLET | Freq: Every day | ORAL | 0 refills | Status: DC

## 2018-02-27 NOTE — Progress Notes (Signed)
Subjective: 37 year old male presents the office with concerns of pain in the bottom of his left heel.  Is been ongoing for about 3 months.  He states that he does get better but that has not.  Feels like a stone bruise.  Worse with going barefoot.  Denies any recent injury or trauma.  No significant swelling.  He did have x-rays previously his primary care physician office and was told he did not have a bone spur fracture. Denies any systemic complaints such as fevers, chills, nausea, vomiting. No acute changes since last appointment, and no other complaints at this time.   Last A1c was 7.6  Objective: AAO x3, NAD DP/PT pulses palpable bilaterally, CRT less than 3 seconds Tenderness palpation on the plantar medial tubercle as well as the plantar central aspect the left heel.  No pain over the course of plantar fascia the arch of the foot.  Plantar fascial, Achilles tendon appear to be intact.  No pain with lateral compression of the calcaneus. No open lesions or pre-ulcerative lesions.  No pain with calf compression, swelling, warmth, erythema  Assessment: 37 year old male left heel pain, plantar fasciitis  Plan: -All treatment options discussed with the patient including all alternatives, risks, complications.  -Steroid injection performed.  See procedure note below.  Discussed stretching, icing exercises daily.  Plantar fascial brace was dispensed.  Discussed shoe modifications and orthotics.  We will check orthotic coverage for him. Prescribed mobic. Discussed side effects of the medication and directed to stop if any are to occur and call the office.  -Patient encouraged to call the office with any questions, concerns, change in symptoms.   Procedure: Injection Tendon/Ligament Discussed alternatives, risks, complications and verbal consent was obtained.  Location: Left plantar fascia at the glabrous junction; medial approach. Skin Prep: Alcohol. Injectate: 0.5cc 0.5% marcaine plain, 0.5  cc 2% lidocaine plain and, 1 cc kenalog 10. Disposition: Patient tolerated procedure well. Injection site dressed with a band-aid.  Post-injection care was discussed and return precautions discussed.    Vivi Barrack DPM

## 2018-03-01 DIAGNOSIS — M722 Plantar fascial fibromatosis: Secondary | ICD-10-CM | POA: Insufficient documentation

## 2018-03-02 ENCOUNTER — Telehealth: Payer: Self-pay | Admitting: Podiatry

## 2018-03-02 NOTE — Telephone Encounter (Signed)
I informed pt's wife, Dorathy DaftKayla that pt should complete the Mobic and if necessary make an appt to come, and the orthotics in office were custom made and would need insurance pre-cert. Kayla states Dr. Ardelle AntonWagoner said he could get OTC orthotics and I told her PowerStep or [employer]Tech.

## 2018-03-02 NOTE — Telephone Encounter (Signed)
Patients wife called. Pt was seen a few days ago about foot pain and the doctor suggested insoles. Patient is wanting to know what type of insole he should be looking to buy.  Also, patient was prescribed meloxicam, should he finish the bottle or how many days should he take it?

## 2018-03-08 ENCOUNTER — Telehealth: Payer: Self-pay | Admitting: Podiatry

## 2018-03-08 NOTE — Telephone Encounter (Signed)
Called pt to give benefit information for orthotics and voicemail is full.

## 2018-03-22 ENCOUNTER — Other Ambulatory Visit: Payer: Self-pay | Admitting: Podiatry

## 2018-05-10 DIAGNOSIS — Z794 Long term (current) use of insulin: Secondary | ICD-10-CM | POA: Diagnosis not present

## 2018-05-10 DIAGNOSIS — E1065 Type 1 diabetes mellitus with hyperglycemia: Secondary | ICD-10-CM | POA: Diagnosis not present

## 2018-06-17 DIAGNOSIS — S0501XA Injury of conjunctiva and corneal abrasion without foreign body, right eye, initial encounter: Secondary | ICD-10-CM | POA: Diagnosis not present

## 2018-06-19 DIAGNOSIS — T1511XD Foreign body in conjunctival sac, right eye, subsequent encounter: Secondary | ICD-10-CM | POA: Diagnosis not present

## 2018-08-15 DIAGNOSIS — E109 Type 1 diabetes mellitus without complications: Secondary | ICD-10-CM | POA: Diagnosis not present

## 2018-08-15 DIAGNOSIS — Z794 Long term (current) use of insulin: Secondary | ICD-10-CM | POA: Diagnosis not present

## 2018-11-20 DIAGNOSIS — E1165 Type 2 diabetes mellitus with hyperglycemia: Secondary | ICD-10-CM | POA: Diagnosis not present

## 2018-11-20 DIAGNOSIS — Z794 Long term (current) use of insulin: Secondary | ICD-10-CM | POA: Diagnosis not present

## 2018-11-20 DIAGNOSIS — E109 Type 1 diabetes mellitus without complications: Secondary | ICD-10-CM | POA: Diagnosis not present

## 2019-04-23 DIAGNOSIS — Z794 Long term (current) use of insulin: Secondary | ICD-10-CM | POA: Diagnosis not present

## 2019-04-23 DIAGNOSIS — E109 Type 1 diabetes mellitus without complications: Secondary | ICD-10-CM | POA: Diagnosis not present

## 2019-07-18 DIAGNOSIS — J019 Acute sinusitis, unspecified: Secondary | ICD-10-CM | POA: Diagnosis not present

## 2019-08-07 DIAGNOSIS — Z794 Long term (current) use of insulin: Secondary | ICD-10-CM | POA: Diagnosis not present

## 2019-08-07 DIAGNOSIS — E109 Type 1 diabetes mellitus without complications: Secondary | ICD-10-CM | POA: Diagnosis not present

## 2019-11-13 DIAGNOSIS — Z13220 Encounter for screening for lipoid disorders: Secondary | ICD-10-CM | POA: Diagnosis not present

## 2019-11-13 DIAGNOSIS — Z794 Long term (current) use of insulin: Secondary | ICD-10-CM | POA: Diagnosis not present

## 2019-11-13 DIAGNOSIS — E1065 Type 1 diabetes mellitus with hyperglycemia: Secondary | ICD-10-CM | POA: Diagnosis not present

## 2019-11-14 ENCOUNTER — Telehealth: Payer: Self-pay | Admitting: Nutrition

## 2019-11-14 NOTE — Telephone Encounter (Signed)
LVM to call me to schedule appointment to discuss insulin pump therapy.

## 2019-11-26 ENCOUNTER — Telehealth: Payer: Self-pay | Admitting: Nutrition

## 2019-11-26 NOTE — Telephone Encounter (Signed)
LVM to call me to schedule an appointment to review latest devices for insulin delivery and blood glucose monitoring with a finger stick

## 2019-11-26 NOTE — Telephone Encounter (Signed)
Opened in error

## 2019-11-28 ENCOUNTER — Telehealth: Payer: Self-pay | Admitting: Nutrition

## 2019-11-28 NOTE — Telephone Encounter (Signed)
LVM that appointment confirmed for pump discussion on 8/24 at 11AM

## 2019-12-11 ENCOUNTER — Other Ambulatory Visit: Payer: Self-pay

## 2019-12-11 ENCOUNTER — Encounter: Payer: BC Managed Care – PPO | Attending: Internal Medicine | Admitting: Nutrition

## 2019-12-11 DIAGNOSIS — E1065 Type 1 diabetes mellitus with hyperglycemia: Secondary | ICD-10-CM | POA: Diagnosis not present

## 2019-12-12 NOTE — Progress Notes (Signed)
Kenneth Ellison was identified by name and DOB.  He is here to discuss insulin pump therapy.  He reports that he does not want to go on a pump.  He tried a Dexcom before and it was pulled out by his new born, or several occasions.  He wants good blood sugar control, but says he does to want to have something attached to him and "have to deal with all of that".   His has a Advice worker, and his activity is variable day to day/season to season, but says he adjusts his insulin dose accordingly, with supplements of juice or soda when low, that he keeps with him at all times.  He reports that he now has his second child and that he wants to get his HgbA1C down. Problems identified; 1.  Pt testing blood sugars ac meals with what he says are "pretty good blood sugars" before meals, but HgbA1C is over 8%.  Discussed this, and he realizes that his readings are going up after meals, but does not know how hight 2.  Says has multiple lows during the night when he is very active during the day, which he treats with 4-6 ounces of juice.  Discussed how the pump works to be able to reduce basal insulin during activity and during the night, as well as reducing/stoppling insulin flow for this before it happens. 3.  Takes his meal time insulin after he eats, causing higher readings after meals. But he swears that if he takes the insulin before meals, he does not know what he is eating, and what his activity will be like.  Says he takes this about 10-15 minutes after meals. Discussed the new insulins that are available that can be taken after meals with better 2hr. pc readings.  He is very hesitant about taking something new.  Says he works alone, but can always recognize low blood sugars 4. Complained that when he wore a sensor before, it was never accurate.  Explained how sensors read not blood sugar, but interstitial blood sugar, and that when his readings are going up/down quickly, the sugars from the meals have not had time to move  into the tissue, or when low, that it will take 40 minutes to move into tissue-after distributing it to the tissues that need it most.   He has agreed that he would like to wear a sensor and review his readings with me in 10 days before deciding if he wants to try a insulin pump.   He was shown the 3 different pumps, but did not seem at all interested Conclusion: I believe I will have to work with him slowly, to get him used to each device/CGM first before moving to a insulin pump.  I believe that if I can get him to see that the CGM is helpful to him, maybe he can at least take his insulin ac meals, and then move on take advantage of other new pump options, like stopping lows during the night, etc.  A Dexcom sensor was put on his lower back area, linking this to his phone, and my computer. Lot #:[id number]   Exp. Date: 3/22. He agreed to call me to do a phone consult after the 10 day wear to discuss results.

## 2019-12-12 NOTE — Patient Instructions (Signed)
1. Wear sensor for 10 days and return for review of blood sugar readings.

## 2020-03-18 DIAGNOSIS — Z794 Long term (current) use of insulin: Secondary | ICD-10-CM | POA: Diagnosis not present

## 2020-03-18 DIAGNOSIS — E1065 Type 1 diabetes mellitus with hyperglycemia: Secondary | ICD-10-CM | POA: Diagnosis not present

## 2020-06-16 DIAGNOSIS — E1065 Type 1 diabetes mellitus with hyperglycemia: Secondary | ICD-10-CM | POA: Diagnosis not present

## 2020-06-16 DIAGNOSIS — Z794 Long term (current) use of insulin: Secondary | ICD-10-CM | POA: Diagnosis not present

## 2020-09-09 DIAGNOSIS — E1065 Type 1 diabetes mellitus with hyperglycemia: Secondary | ICD-10-CM | POA: Diagnosis not present

## 2020-09-09 DIAGNOSIS — Z794 Long term (current) use of insulin: Secondary | ICD-10-CM | POA: Diagnosis not present

## 2020-09-09 LAB — HEPATIC FUNCTION PANEL
ALT: 20 (ref 10–40)
AST: 15 (ref 14–40)
Bilirubin, Direct: 0.2 (ref 0.01–0.4)
Bilirubin, Total: 0.8

## 2020-09-09 LAB — BASIC METABOLIC PANEL
Creatinine: 0.9 (ref 0.6–1.3)
Potassium: 3.9 (ref 3.4–5.3)
Sodium: 137 (ref 137–147)

## 2020-09-09 LAB — LIPID PANEL
Cholesterol: 179 (ref 0–200)
HDL: 56 (ref 35–70)
LDL Cholesterol: 110
LDl/HDL Ratio: 3.2
Triglycerides: 72 (ref 40–160)

## 2020-09-09 LAB — HEMOGLOBIN A1C: Hemoglobin A1C: 8.5

## 2020-09-09 LAB — COMPREHENSIVE METABOLIC PANEL: Albumin: 4.6 (ref 3.5–5.0)

## 2020-09-09 LAB — TSH: TSH: 1.69 (ref 0.41–5.90)

## 2020-09-09 LAB — MICROALBUMIN, URINE: Microalb, Ur: 0.7

## 2020-09-24 ENCOUNTER — Encounter: Payer: Self-pay | Admitting: Primary Care

## 2020-12-09 DIAGNOSIS — E1165 Type 2 diabetes mellitus with hyperglycemia: Secondary | ICD-10-CM | POA: Diagnosis not present

## 2020-12-09 DIAGNOSIS — Z794 Long term (current) use of insulin: Secondary | ICD-10-CM | POA: Diagnosis not present

## 2021-03-17 DIAGNOSIS — Z794 Long term (current) use of insulin: Secondary | ICD-10-CM | POA: Diagnosis not present

## 2021-03-17 DIAGNOSIS — E1065 Type 1 diabetes mellitus with hyperglycemia: Secondary | ICD-10-CM | POA: Diagnosis not present

## 2021-05-06 ENCOUNTER — Encounter: Payer: BC Managed Care – PPO | Admitting: Primary Care

## 2021-05-19 ENCOUNTER — Ambulatory Visit (INDEPENDENT_AMBULATORY_CARE_PROVIDER_SITE_OTHER): Payer: BC Managed Care – PPO | Admitting: Primary Care

## 2021-05-19 ENCOUNTER — Encounter: Payer: Self-pay | Admitting: Primary Care

## 2021-05-19 ENCOUNTER — Other Ambulatory Visit: Payer: Self-pay

## 2021-05-19 VITALS — BP 124/72 | HR 77 | Temp 98.6°F | Ht 67.0 in | Wt 154.0 lb

## 2021-05-19 DIAGNOSIS — E1065 Type 1 diabetes mellitus with hyperglycemia: Secondary | ICD-10-CM

## 2021-05-19 DIAGNOSIS — Z23 Encounter for immunization: Secondary | ICD-10-CM | POA: Diagnosis not present

## 2021-05-19 DIAGNOSIS — Z0000 Encounter for general adult medical examination without abnormal findings: Secondary | ICD-10-CM | POA: Diagnosis not present

## 2021-05-19 NOTE — Assessment & Plan Note (Signed)
Following with [facility], Dr. Buddy Duty.  Last A1C of 8.5 per Care Everywhere.  Continue Tresiba 28 units daily and Novolog 15-18 units per meal.

## 2021-05-19 NOTE — Patient Instructions (Signed)
It was a pleasure to see you today!  Preventive Care 39-39 Years Old, Male Preventive care refers to lifestyle choices and visits with your health care provider that can promote health and wellness. Preventive care visits are also called wellness exams. What can I expect for my preventive care visit? Counseling During your preventive care visit, your health care provider may ask about your: Medical history, including: Past medical problems. Family medical history. Current health, including: Emotional well-being. Home life and relationship well-being. Sexual activity. Lifestyle, including: Alcohol, nicotine or tobacco, and drug use. Access to firearms. Diet, exercise, and sleep habits. Safety issues such as seatbelt and bike helmet use. Sunscreen use. Work and work Astronomer. Physical exam Your health care provider may check your: Height and weight. These may be used to calculate your BMI (body mass index). BMI is a measurement that tells if you are at a healthy weight. Waist circumference. This measures the distance around your waistline. This measurement also tells if you are at a healthy weight and may help predict your risk of certain diseases, such as type 2 diabetes and high blood pressure. Heart rate and blood pressure. Body temperature. Skin for abnormal spots. What immunizations do I need? Vaccines are usually given at various ages, according to a schedule. Your health care provider will recommend vaccines for you based on your age, medical history, and lifestyle or other factors, such as travel or where you work. What tests do I need? Screening Your health care provider may recommend screening tests for certain conditions. This may include: Lipid and cholesterol levels. Diabetes screening. This is done by checking your blood sugar (glucose) after you have not eaten for a while (fasting). Hepatitis B test. Hepatitis C test. HIV (human immunodeficiency virus) test. STI  (sexually transmitted infection) testing, if you are at risk. Talk with your health care provider about your test results, treatment options, and if necessary, the need for more tests. Follow these instructions at home: Eating and drinking  Eat a healthy diet that includes fresh fruits and vegetables, whole grains, lean protein, and low-fat dairy products. Drink enough fluid to keep your urine pale yellow. Take vitamin and mineral supplements as recommended by your health care provider. Do not drink alcohol if your health care provider tells you not to drink. If you drink alcohol: Limit how much you have to 0-2 drinks a day. Know how much alcohol is in your drink. In the [place] one drink equals one 12 oz bottle of beer (355 mL), one 5 oz glass of wine (148 mL), or one 1 oz glass of hard liquor (44 mL). Lifestyle Brush your teeth every morning and night with fluoride toothpaste. Floss one time each day. Exercise for at least 30 minutes 5 or more days each week. Do not use any products that contain nicotine or tobacco. These products include cigarettes, chewing tobacco, and vaping devices, such as e-cigarettes. If you need help quitting, ask your health care provider. Do not use drugs. If you are sexually active, practice safe sex. Use a condom or other form of protection to prevent STIs. Find healthy ways to manage stress, such as: Meditation, yoga, or listening to music. Journaling. Talking to a trusted person. Spending time with friends and family. Minimize exposure to UV radiation to reduce your risk of skin cancer. Safety Always wear your seat belt while driving or riding in a vehicle. Do not drive: If you have been drinking alcohol. Do not ride with someone who has been drinking.  If you have been using any mind-altering substances or drugs. While texting. When you are tired or distracted. Wear a helmet and other protective equipment during sports activities. If you have firearms  in your house, make sure you follow all gun safety procedures. Seek help if you have been physically or sexually abused. What's next? Go to your health care provider once a year for an annual wellness visit. Ask your health care provider how often you should have your eyes and teeth checked. Stay up to date on all vaccines. This information is not intended to replace advice given to you by your health care provider. Make sure you discuss any questions you have with your health care provider. Document Revised: 10/01/2020 Document Reviewed: 10/01/2020 Elsevier Patient Education  2022 [employer].

## 2021-05-19 NOTE — Assessment & Plan Note (Signed)
Tetanus due, provided today. Declines influenza vaccine.  Encouraged a healthy diet and regular exercise.   Exam today unremarkable. Labs reviewed from Care Everywhere.

## 2021-05-19 NOTE — Progress Notes (Signed)
Subjective:    Patient ID: Kenneth Ellison, male    DOB: 05/06/1980, 41 y.o.   MRN: [id number]  HPI  Kenneth Ellison is a very pleasant 41 y.o. male who presents today to re-establish care, for complete physical, and follow up of chronic conditions.  He has not been seen in our clinic since September 2019.   Following with Dr. Sharl Ma through [facility] every 3 months. Last A1C was 8.5 in November 2022. Managed on Tresiba 28 units daily and then recently transitioned to Learned 15-18 units with meals.   Immunizations: -Tetanus: Over 10 years ago, due today -Influenza: Declines  -Covid-19: Has not completed   Diet: Fair diet Exercise: Regular exercise.   Eye exam: Completes annually  Dental exam: Completes semi-annually   BP Readings from Last 3 Encounters:  05/19/21 124/72  01/03/18 118/74  03/31/17 126/71      Review of Systems  Constitutional:  Negative for unexpected weight change.  HENT:  Negative for rhinorrhea.   Respiratory:  Negative for cough and shortness of breath.   Cardiovascular:  Negative for chest pain.  Gastrointestinal:  Negative for constipation and diarrhea.  Genitourinary:  Negative for difficulty urinating.  Musculoskeletal:  Negative for arthralgias and myalgias.  Skin:  Negative for rash.  Allergic/Immunologic: Negative for environmental allergies.  Neurological:  Negative for dizziness, numbness and headaches.  Psychiatric/Behavioral:  The patient is not nervous/anxious.         Past Medical History:  Diagnosis Date   Pain of left heel 01/03/2018   Right upper quadrant abdominal pain 01/03/2018   type 1 diabetes     Social History   Socioeconomic History   Marital status: Married    Spouse name: Not on file   Number of children: Not on file   Years of education: Not on file   Highest education level: Not on file  Occupational History   Not on file  Tobacco Use   Smoking status: Never   Smokeless tobacco: Former  Substance and  Sexual Activity   Alcohol use: No   Drug use: No   Sexual activity: Not on file  Other Topics Concern   Not on file  Social History Narrative   Married.   1 child.    Works as a Psychologist, sport and exercise, Aeronautical engineer.   Enjoys hunting, fishing.    Social Determinants of Health   Financial Resource Strain: Not on file  Food Insecurity: Not on file  Transportation Needs: Not on file  Physical Activity: Not on file  Stress: Not on file  Social Connections: Not on file  Intimate Partner Violence: Not on file    History reviewed. No pertinent surgical history.  Family History  Problem Relation Age of Onset   Lupus Mother    Fibromyalgia Mother    Arthritis Mother    Diabetes type II Mother    Diabetes type II Father    Lung cancer Maternal Grandfather    Cancer Paternal Grandfather        Unknown    Allergies  Allergen Reactions   Sulfa Antibiotics     Current Outpatient Medications on File Prior to Visit  Medication Sig Dispense Refill   glucose blood test strip      insulin aspart (FIASP FLEXTOUCH) 100 UNIT/ML FlexTouch Pen 15-18 Units 3 (three) times daily with meals as needed.     insulin degludec (TRESIBA) 100 UNIT/ML FlexTouch Pen Inject 28 Units as directed every morning.     Insulin Pen Needle  32G X 4 MM MISC      ONETOUCH DELICA LANCETS 33G MISC      No current facility-administered medications on file prior to visit.    BP 124/72    Pulse 77    Temp 98.6 F (37 C) (Temporal)    Ht 5\' 7"  (1.702 m)    Wt 154 lb (69.9 kg)    SpO2 98%    BMI 24.12 kg/m  Objective:   Physical Exam HENT:     Right Ear: Tympanic membrane and ear canal normal.     Left Ear: Tympanic membrane and ear canal normal.     Nose: Nose normal.     Right Sinus: No maxillary sinus tenderness or frontal sinus tenderness.     Left Sinus: No maxillary sinus tenderness or frontal sinus tenderness.  Eyes:     Conjunctiva/sclera: Conjunctivae normal.  Neck:     Thyroid: No thyromegaly.      Vascular: No carotid bruit.  Cardiovascular:     Rate and Rhythm: Normal rate and regular rhythm.     Heart sounds: Normal heart sounds.  Pulmonary:     Effort: Pulmonary effort is normal.     Breath sounds: Normal breath sounds. No wheezing or rales.  Abdominal:     General: Bowel sounds are normal.     Palpations: Abdomen is soft.     Tenderness: There is no abdominal tenderness.  Musculoskeletal:        General: Normal range of motion.     Cervical back: Neck supple.  Skin:    General: Skin is warm and dry.  Neurological:     Mental Status: He is alert and oriented to person, place, and time.     Cranial Nerves: No cranial nerve deficit.     Deep Tendon Reflexes: Reflexes are normal and symmetric.  Psychiatric:        Mood and Affect: Mood normal.          Assessment & Plan:      This visit occurred during the SARS-CoV-2 public health emergency.  Safety protocols were in place, including screening questions prior to the visit, additional usage of staff PPE, and extensive cleaning of exam room while observing appropriate contact time as indicated for disinfecting solutions.

## 2021-05-19 NOTE — Addendum Note (Signed)
Addended by: Donnamarie Poag on: 05/19/2021 03:54 PM   Modules accepted: Orders

## 2021-07-07 DIAGNOSIS — E1065 Type 1 diabetes mellitus with hyperglycemia: Secondary | ICD-10-CM | POA: Diagnosis not present

## 2021-07-07 DIAGNOSIS — Z794 Long term (current) use of insulin: Secondary | ICD-10-CM | POA: Diagnosis not present

## 2021-10-13 DIAGNOSIS — E1065 Type 1 diabetes mellitus with hyperglycemia: Secondary | ICD-10-CM | POA: Diagnosis not present

## 2021-10-13 DIAGNOSIS — Z794 Long term (current) use of insulin: Secondary | ICD-10-CM | POA: Diagnosis not present

## 2022-01-26 DIAGNOSIS — E1065 Type 1 diabetes mellitus with hyperglycemia: Secondary | ICD-10-CM | POA: Diagnosis not present

## 2022-01-26 DIAGNOSIS — Z794 Long term (current) use of insulin: Secondary | ICD-10-CM | POA: Diagnosis not present

## 2022-04-02 DIAGNOSIS — J0100 Acute maxillary sinusitis, unspecified: Secondary | ICD-10-CM | POA: Diagnosis not present

## 2022-04-29 DIAGNOSIS — E1065 Type 1 diabetes mellitus with hyperglycemia: Secondary | ICD-10-CM | POA: Diagnosis not present

## 2022-05-25 ENCOUNTER — Encounter: Payer: Self-pay | Admitting: Primary Care

## 2022-05-25 ENCOUNTER — Ambulatory Visit (INDEPENDENT_AMBULATORY_CARE_PROVIDER_SITE_OTHER): Payer: BC Managed Care – PPO | Admitting: Primary Care

## 2022-05-25 VITALS — BP 136/82 | HR 82 | Temp 97.3°F | Ht 67.0 in | Wt 158.0 lb

## 2022-05-25 DIAGNOSIS — Z0000 Encounter for general adult medical examination without abnormal findings: Secondary | ICD-10-CM

## 2022-05-25 DIAGNOSIS — E1065 Type 1 diabetes mellitus with hyperglycemia: Secondary | ICD-10-CM | POA: Diagnosis not present

## 2022-05-25 NOTE — Progress Notes (Signed)
Subjective:    Patient ID: Kenneth Ellison, male    DOB: [birth year], 42 y.o.   MRN: [id number]  HPI  Kenneth Ellison is a very pleasant 42 y.o. male who presents today for complete physical and follow up of chronic conditions.  Immunizations: -Tetanus: Completed in 2023 -Influenza: Declines  Diet: Fair diet. Recently changed over to a non processed diet.   Exercise: No regular exercise.  Eye exam: Completes annually  Dental exam: Completes semi-annually   BP Readings from Last 3 Encounters:  05/25/22 136/82  05/19/21 124/72  01/03/18 118/74      Review of Systems  Constitutional:  Negative for unexpected weight change.  HENT:  Negative for rhinorrhea.   Respiratory:  Negative for cough and shortness of breath.   Cardiovascular:  Negative for chest pain.  Gastrointestinal:  Negative for constipation and diarrhea.  Genitourinary:  Negative for difficulty urinating.  Musculoskeletal:  Negative for arthralgias and myalgias.  Skin:  Negative for rash.  Allergic/Immunologic: Negative for environmental allergies.  Neurological:  Negative for dizziness, numbness and headaches.  Psychiatric/Behavioral:  The patient is not nervous/anxious.          Past Medical History:  Diagnosis Date   Pain of left heel 01/03/2018   Right upper quadrant abdominal pain 01/03/2018   type 1 diabetes     Social History   Socioeconomic History   Marital status: Married    Spouse name: Not on file   Number of children: Not on file   Years of education: Not on file   Highest education level: Not on file  Occupational History   Not on file  Tobacco Use   Smoking status: Never   Smokeless tobacco: Former  Substance and Sexual Activity   Alcohol use: No   Drug use: No   Sexual activity: Not on file  Other Topics Concern   Not on file  Social History Narrative   Married.   1 child.    Works as a Armed forces operational officer, Biomedical scientist.   Enjoys hunting, fishing.    Social Determinants of  Health   Financial Resource Strain: Not on file  Food Insecurity: Not on file  Transportation Needs: Not on file  Physical Activity: Not on file  Stress: Not on file  Social Connections: Not on file  Intimate Partner Violence: Not on file    History reviewed. No pertinent surgical history.  Family History  Problem Relation Age of Onset   Lupus Mother    Fibromyalgia Mother    Arthritis Mother    Diabetes type II Mother    Diabetes type II Father    Lung cancer Maternal Grandfather    Cancer Paternal Grandfather        Unknown    Allergies  Allergen Reactions   Sulfa Antibiotics     Current Outpatient Medications on File Prior to Visit  Medication Sig Dispense Refill   glucose blood test strip      insulin aspart (FIASP FLEXTOUCH) 100 UNIT/ML FlexTouch Pen 15-18 Units 3 (three) times daily with meals as needed.     insulin degludec (TRESIBA) 100 UNIT/ML FlexTouch Pen Inject 28 Units as directed every morning.     Insulin Pen Needle 32G X 4 MM MISC      ONETOUCH DELICA LANCETS 25K MISC      No current facility-administered medications on file prior to visit.    BP 136/82   Pulse 82   Temp (!) 97.3 F (36.3 C) (Temporal)  Ht 5\' 7"  (1.702 m)   Wt 158 lb (71.7 kg)   SpO2 99%   BMI 24.75 kg/m  Objective:   Physical Exam HENT:     Right Ear: Tympanic membrane and ear canal normal.     Left Ear: Tympanic membrane and ear canal normal.     Nose: Nose normal.     Right Sinus: No maxillary sinus tenderness or frontal sinus tenderness.     Left Sinus: No maxillary sinus tenderness or frontal sinus tenderness.  Eyes:     Conjunctiva/sclera: Conjunctivae normal.  Neck:     Thyroid: No thyromegaly.     Vascular: No carotid bruit.  Cardiovascular:     Rate and Rhythm: Normal rate and regular rhythm.     Heart sounds: Normal heart sounds.  Pulmonary:     Effort: Pulmonary effort is normal.     Breath sounds: Normal breath sounds. No wheezing or rales.  Abdominal:      General: Bowel sounds are normal.     Palpations: Abdomen is soft.     Tenderness: There is no abdominal tenderness.  Musculoskeletal:        General: Normal range of motion.     Cervical back: Neck supple.  Skin:    General: Skin is warm and dry.  Neurological:     Mental Status: He is alert and oriented to person, place, and time.     Cranial Nerves: No cranial nerve deficit.     Deep Tendon Reflexes: Reflexes are normal and symmetric.  Psychiatric:        Mood and Affect: Mood normal.           Assessment & Plan:  Type 1 diabetes mellitus with hyperglycemia [facility]) Assessment & Plan: Following with endocrinology. Reviewed A1C from January 2024 through [facility] notes.  Continue Tresiba 25 units daily and Aspart Insulin 15-18 units TID with meals.  Commended him on dietary changes. Continue with [employer].  Foot exam today. Urine microalbumin due and pending.   Preventative health care Assessment & Plan: Immunizations UTD.  Discussed the importance of a healthy diet and regular exercise in order for weight loss, and to reduce the risk of further co-morbidity. Commended him on dietary changes.   Exam stable. Labs reviewed from endocrinology from January 2024.  Follow up in 1 year for repeat physical.          Pleas Koch, NP

## 2022-05-25 NOTE — Assessment & Plan Note (Addendum)
Following with endocrinology. Reviewed A1C from January 2024 through [facility] notes.  Continue Tresiba 25 units daily and Aspart Insulin 15-18 units TID with meals.  Commended him on dietary changes. Continue with [employer].  Foot exam today. Urine microalbumin due and pending.

## 2022-05-25 NOTE — Assessment & Plan Note (Addendum)
Immunizations UTD.  Discussed the importance of a healthy diet and regular exercise in order for weight loss, and to reduce the risk of further co-morbidity. Commended him on dietary changes.   Exam stable. Labs reviewed from endocrinology from January 2024.  Follow up in 1 year for repeat physical.

## 2022-06-01 DIAGNOSIS — J349 Unspecified disorder of nose and nasal sinuses: Secondary | ICD-10-CM | POA: Diagnosis not present

## 2022-06-29 DIAGNOSIS — H53143 Visual discomfort, bilateral: Secondary | ICD-10-CM | POA: Diagnosis not present

## 2022-06-29 DIAGNOSIS — E119 Type 2 diabetes mellitus without complications: Secondary | ICD-10-CM | POA: Diagnosis not present

## 2022-08-03 DIAGNOSIS — E1065 Type 1 diabetes mellitus with hyperglycemia: Secondary | ICD-10-CM | POA: Diagnosis not present

## 2022-08-03 DIAGNOSIS — Z794 Long term (current) use of insulin: Secondary | ICD-10-CM | POA: Diagnosis not present

## 2023-03-13 DIAGNOSIS — J029 Acute pharyngitis, unspecified: Secondary | ICD-10-CM | POA: Diagnosis not present

## 2023-04-05 DIAGNOSIS — Z87898 Personal history of other specified conditions: Secondary | ICD-10-CM | POA: Diagnosis not present

## 2023-04-05 DIAGNOSIS — Z794 Long term (current) use of insulin: Secondary | ICD-10-CM | POA: Diagnosis not present

## 2023-04-05 DIAGNOSIS — E1065 Type 1 diabetes mellitus with hyperglycemia: Secondary | ICD-10-CM | POA: Diagnosis not present

## 2023-05-31 ENCOUNTER — Ambulatory Visit (INDEPENDENT_AMBULATORY_CARE_PROVIDER_SITE_OTHER): Payer: BC Managed Care – PPO | Admitting: Primary Care

## 2023-05-31 ENCOUNTER — Encounter: Payer: Self-pay | Admitting: Primary Care

## 2023-05-31 VITALS — BP 138/76 | HR 82 | Temp 97.7°F | Ht 67.0 in | Wt 147.0 lb

## 2023-05-31 DIAGNOSIS — E785 Hyperlipidemia, unspecified: Secondary | ICD-10-CM

## 2023-05-31 DIAGNOSIS — Z0000 Encounter for general adult medical examination without abnormal findings: Secondary | ICD-10-CM

## 2023-05-31 DIAGNOSIS — E1065 Type 1 diabetes mellitus with hyperglycemia: Secondary | ICD-10-CM

## 2023-05-31 MED ORDER — ATORVASTATIN CALCIUM 10 MG PO TABS: 10.0000 mg | ORAL_TABLET | Freq: Every day | ORAL | 3 refills | Status: DC

## 2023-05-31 NOTE — Progress Notes (Signed)
Subjective:    Patient ID: Kenneth Ellison, male    DOB: 21-Jul-1980, 43 y.o.   MRN: [id number]  HPI  Kenneth Ellison is a very pleasant 43 y.o. male who presents today for complete physical and follow up of chronic conditions.  Immunizations: -Tetanus: Completed in 2023 -Influenza: Declines influenza vaccine.   Diet: Fair diet.  Exercise: Active at home. Lifting weights.   Eye exam: Completes annually  Dental exam: Completes semi-annually     BP Readings from Last 3 Encounters:  05/31/23 138/76  05/25/22 136/82  05/19/21 124/72       Review of Systems  Constitutional:  Negative for unexpected weight change.  HENT:  Negative for rhinorrhea.   Eyes:  Negative for visual disturbance.  Respiratory:  Negative for cough and shortness of breath.   Cardiovascular:  Negative for chest pain.  Gastrointestinal:  Negative for constipation and diarrhea.  Genitourinary:  Negative for difficulty urinating.  Musculoskeletal:  Negative for arthralgias and myalgias.  Skin:  Negative for rash.  Allergic/Immunologic: Negative for environmental allergies.  Neurological:  Negative for dizziness, numbness and headaches.  Psychiatric/Behavioral:  The patient is not nervous/anxious.          Past Medical History:  Diagnosis Date   Pain of left heel 01/03/2018   Right upper quadrant abdominal pain 01/03/2018   type 1 diabetes     Social History   Socioeconomic History   Marital status: Married    Spouse name: Not on file   Number of children: Not on file   Years of education: Not on file   Highest education level: Not on file  Occupational History   Not on file  Tobacco Use   Smoking status: Never   Smokeless tobacco: Former  Substance and Sexual Activity   Alcohol use: No   Drug use: No   Sexual activity: Not on file  Other Topics Concern   Not on file  Social History Narrative   Married.   1 child.    Works as a Psychologist, sport and exercise, Aeronautical engineer.   Enjoys hunting, fishing.     Social Drivers of Corporate investment banker Strain: Not on file  Food Insecurity: Not on file  Transportation Needs: Not on file  Physical Activity: Not on file  Stress: Not on file  Social Connections: Not on file  Intimate Partner Violence: Not on file    History reviewed. No pertinent surgical history.  Family History  Problem Relation Age of Onset   Lupus Mother    Fibromyalgia Mother    Arthritis Mother    Diabetes type II Mother    Diabetes type II Father    Lung cancer Maternal Grandfather    Cancer Paternal Grandfather        Unknown    Allergies  Allergen Reactions   Sulfa Antibiotics     Current Outpatient Medications on File Prior to Visit  Medication Sig Dispense Refill   glucose blood test strip      insulin aspart (FIASP FLEXTOUCH) 100 UNIT/ML FlexTouch Pen 15-18 Units 3 (three) times daily with meals as needed.     insulin degludec (TRESIBA) 100 UNIT/ML FlexTouch Pen Inject 28 Units as directed every morning.     Insulin Pen Needle 32G X 4 MM MISC      ONETOUCH DELICA LANCETS 33G MISC      No current facility-administered medications on file prior to visit.    BP 138/76   Pulse 82   Temp 97.7  F (36.5 C) (Temporal)   Ht 5\' 7"  (1.702 m)   Wt 147 lb (66.7 kg)   SpO2 99%   BMI 23.02 kg/m  Objective:   Physical Exam HENT:     Right Ear: Tympanic membrane and ear canal normal.     Left Ear: Tympanic membrane and ear canal normal.  Eyes:     Pupils: Pupils are equal, round, and reactive to light.  Cardiovascular:     Rate and Rhythm: Normal rate and regular rhythm.  Pulmonary:     Effort: Pulmonary effort is normal.     Breath sounds: Normal breath sounds.  Abdominal:     General: Bowel sounds are normal.     Palpations: Abdomen is soft.     Tenderness: There is no abdominal tenderness.  Musculoskeletal:        General: Normal range of motion.     Cervical back: Neck supple.  Skin:    General: Skin is warm and dry.  Neurological:      Mental Status: He is alert and oriented to person, place, and time.     Cranial Nerves: No cranial nerve deficit.     Deep Tendon Reflexes:     Reflex Scores:      Patellar reflexes are 2+ on the right side and 2+ on the left side. Psychiatric:        Mood and Affect: Mood normal.           Assessment & Plan:  Preventative health care Assessment & Plan: Immunizations UTD. Declines influenza vaccine.  Discussed the importance of a healthy diet and regular exercise in order for weight loss, and to reduce the risk of further co-morbidity.  Exam stable. Labs and office notes reviewed from [facility] endocrinology from January 2025.  Follow up in 1 year for repeat physical.    Type 1 diabetes mellitus with hyperglycemia [facility]) Assessment & Plan: Following with endocrinology. Office notes and labs reviewed from January 2025.  Continue Tresiba 25-28 units daily.  Continue insulin aspart 15-18 units TID with meals.   Hyperlipidemia, unspecified hyperlipidemia type Assessment & Plan: Reviewed labs from [facility] endocrinology from January 2024 and December 2024 for which he has on his phone. Reviewed labs from May 2022 from our office. Will obtain records.  Discussed that statin therapy in the setting of diabetes being a national guideline. He agrees to treat.  Start atorvastatin 10 mg daily.  Repeat lipid panel in 2 months.  Orders: -     Atorvastatin Calcium; Take 1 tablet (10 mg total) by mouth daily. for cholesterol.  Dispense: 90 tablet; Refill: 3 -     Lipid panel; Future -     Hepatic function panel; Future        Doreene Nest, NP

## 2023-05-31 NOTE — Assessment & Plan Note (Addendum)
Following with endocrinology. Office notes and labs reviewed from January 2025.  Continue Tresiba 25-28 units daily.  Continue insulin aspart 15-18 units TID with meals.

## 2023-05-31 NOTE — Patient Instructions (Addendum)
Start atorvastatin 10 mg once daily for cholesterol.  Schedule a lab only appointment for 2 to 3 months to check your cholesterol.  You must be fasting for 4 hours prior.  It was a pleasure to see you today!

## 2023-05-31 NOTE — Assessment & Plan Note (Signed)
Immunizations UTD. Declines influenza vaccine.  Discussed the importance of a healthy diet and regular exercise in order for weight loss, and to reduce the risk of further co-morbidity.  Exam stable. Labs and office notes reviewed from [facility] endocrinology from January 2025.  Follow up in 1 year for repeat physical.

## 2023-05-31 NOTE — Assessment & Plan Note (Signed)
Reviewed labs from [facility] endocrinology from January 2024 and December 2024 for which he has on his phone. Reviewed labs from May 2022 from our office. Will obtain records.  Discussed that statin therapy in the setting of diabetes being a national guideline. He agrees to treat.  Start atorvastatin 10 mg daily.  Repeat lipid panel in 2 months.

## 2023-07-11 DIAGNOSIS — Z794 Long term (current) use of insulin: Secondary | ICD-10-CM | POA: Diagnosis not present

## 2023-07-11 DIAGNOSIS — E1065 Type 1 diabetes mellitus with hyperglycemia: Secondary | ICD-10-CM | POA: Diagnosis not present

## 2023-07-30 ENCOUNTER — Ambulatory Visit (HOSPITAL_COMMUNITY): Admission: EM | Admit: 2023-07-30 | Discharge: 2023-07-30 | Disposition: A | Discharge status: Home / Self Care

## 2023-07-30 ENCOUNTER — Encounter (HOSPITAL_COMMUNITY): Payer: Self-pay

## 2023-07-30 ENCOUNTER — Ambulatory Visit (INDEPENDENT_AMBULATORY_CARE_PROVIDER_SITE_OTHER)

## 2023-07-30 DIAGNOSIS — S6992XA Unspecified injury of left wrist, hand and finger(s), initial encounter: Secondary | ICD-10-CM | POA: Diagnosis not present

## 2023-07-30 DIAGNOSIS — Z23 Encounter for immunization: Secondary | ICD-10-CM

## 2023-07-30 DIAGNOSIS — S61112A Laceration without foreign body of left thumb with damage to nail, initial encounter: Secondary | ICD-10-CM

## 2023-07-30 MED ORDER — TETANUS-DIPHTH-ACELL PERTUSSIS 5-2.5-18.5 LF-MCG/0.5 IM SUSY
PREFILLED_SYRINGE | INTRAMUSCULAR | Status: AC
  Filled 2023-07-30: qty 0.5

## 2023-07-30 MED ORDER — TETANUS-DIPHTH-ACELL PERTUSSIS 5-2.5-18.5 LF-MCG/0.5 IM SUSY
0.5000 mL | PREFILLED_SYRINGE | Freq: Once | INTRAMUSCULAR | Status: AC
  Administered 2023-07-30: 0.5 mL via INTRAMUSCULAR

## 2023-07-30 MED ORDER — CEPHALEXIN 500 MG PO CAPS: 500.0000 mg | ORAL_CAPSULE | Freq: Two times a day (BID) | ORAL | 0 refills | Status: AC

## 2023-07-30 NOTE — ED Triage Notes (Signed)
 Patient here today with c/o left thumb injury about 3 hours ago. Patient was messing with a big spring on a trailer door and the spring un sprung and it pinched his left thumb and cut him.

## 2023-07-30 NOTE — Discharge Instructions (Addendum)
 Start taking Keflex twice daily for 7 days for infection prevention. Keep the area clean, dry, and covered. Avoid getting the area wet within the first 24 hours. You can apply bacitracin or Neosporin to the area for further infection prevention. Return here in 10 to 14 days for suture removal or sooner if you develop worsening pain, redness, swelling, or pus drainage from the area.

## 2023-07-30 NOTE — ED Provider Notes (Addendum)
 [facility]    CSN: [id number] Arrival date & time: 07/30/23  1708      History   Chief Complaint Chief Complaint  Patient presents with   Laceration    HPI Kenneth Ellison is a 43 y.o. male.   Patient presents with left thumb injury that occurred about 3 hours prior to arrival.  Patient was messing with a big spring on a trailer door when this spring pinched his left thumb and and cut the tip of his thumb.  Bleeding controlled at this time.  Patient states that he believes he had a Tdap booster in 2022 but is unsure that.  The history is provided by the patient, medical records and the spouse.  Laceration   Past Medical History:  Diagnosis Date   Pain of left heel 01/03/2018   Right upper quadrant abdominal pain 01/03/2018   type 1 diabetes     Patient Active Problem List   Diagnosis Date Noted   Hyperlipidemia 05/31/2023   Preventative health care 05/19/2021   Plantar fasciitis of left foot 03/01/2018   Type 1 diabetes mellitus with hyperglycemia (HCC) 01/03/2018    History reviewed. No pertinent surgical history.     Home Medications    Prior to Admission medications   Medication Sig Start Date End Date Taking? Authorizing Provider  cephALEXin (KEFLEX) 500 MG capsule Take 1 capsule (500 mg total) by mouth 2 (two) times daily for 7 days. 07/30/23 08/06/23 Yes Micaiah Remillard A, NP  rosuvastatin (CRESTOR) 5 MG tablet Take 5 mg by mouth daily. 07/11/23  Yes [provider]  Continuous Glucose Sensor (FREESTYLE LIBRE 3 SENSOR) MISC change every 14 days for 84 days    [provider]  glucose blood test strip  05/02/14   [provider]  insulin aspart (FIASP FLEXTOUCH) 100 UNIT/ML FlexTouch Pen 15-18 Units 3 (three) times daily with meals as needed. 03/18/20   [provider]  insulin degludec (TRESIBA) 100 UNIT/ML FlexTouch Pen Inject 28 Units as directed every morning. 02/08/15   [provider]  Insulin Pen  Needle 32G X 4 MM MISC  05/03/14   [provider]  Raeford Bullion LANCETS 33G MISC  05/08/14   [provider]    Family History Family History  Problem Relation Age of Onset   Lupus Mother    Fibromyalgia Mother    Arthritis Mother    Diabetes type II Mother    Diabetes type II Father    Lung cancer Maternal Grandfather    Cancer Paternal Grandfather        Unknown    Social History Social History   Tobacco Use   Smoking status: Never   Smokeless tobacco: Former  Substance Use Topics   Alcohol use: No   Drug use: No     Allergies   Sulfa antibiotics   Review of Systems Review of Systems  Per HPI  Physical Exam Triage Vital Signs ED Triage Vitals [07/30/23 1740]  Encounter Vitals Group     BP 129/77     Systolic BP Percentile      Diastolic BP Percentile      Pulse Rate 68     Resp 16     Temp 98.2 F (36.8 C)     Temp Source Oral     SpO2 98 %     Weight      Height      Head Circumference      Peak Flow  Pain Score 5     Pain Loc      Pain Education      Exclude from Growth Chart    No data found.  Updated Vital Signs BP 129/77 (BP Location: Right Arm)   Pulse 68   Temp 98.2 F (36.8 C) (Oral)   Resp 16   SpO2 98%   Visual Acuity Right Eye Distance:   Left Eye Distance:   Bilateral Distance:    Right Eye Near:   Left Eye Near:    Bilateral Near:     Physical Exam Vitals and nursing note reviewed.  Constitutional:      General: He is not in acute distress.    Appearance: Normal appearance. He is not ill-appearing.  Skin:    General: Skin is warm and dry.     Findings: Bruising and laceration present.     Comments: 2 cm flap-like laceration to the tip of the left thumb involving the top part of the nail with bruising to the tip of the thumb and thumbnail.  Neurological:     Mental Status: He is alert.      UC Treatments / Results  Labs (all labs ordered are listed, but only abnormal results are  displayed) Labs Reviewed - No data to display  EKG   Radiology DG Finger Thumb Left Result Date: 07/30/2023 CLINICAL DATA:  Thumb injury EXAM: LEFT THUMB 2+V COMPARISON:  None Available. FINDINGS: There is no evidence of fracture or dislocation. There is no evidence of arthropathy or other focal bone abnormality. Soft tissues are unremarkable. IMPRESSION: Negative. Electronically Signed   By: Tyron Gallon M.D.   On: 07/30/2023 18:16    Procedures Laceration Repair  Date/Time: 07/30/2023 6:47 PM  Performed by: Karon Packer, NP Authorized by: Karon Packer, NP   Consent:    Consent obtained:  Verbal   Consent given by:  Patient   Risks discussed:  Infection, need for additional repair, pain, poor cosmetic result and poor wound healing   Alternatives discussed:  No treatment and delayed treatment Universal protocol:    Procedure explained and questions answered to patient or proxy's satisfaction: yes     Relevant documents present and verified: yes     Imaging studies available: yes     Immediately prior to procedure, a time out was called: yes     Patient identity confirmed:  Verbally with patient Anesthesia:    Anesthesia method:  Local infiltration   Local anesthetic:  Lidocaine 2% w/o epi Laceration details:    Location:  Finger   Finger location:  L thumb   Length (cm):  2   Depth (mm):  2 Pre-procedure details:    Preparation:  Imaging obtained to evaluate for foreign bodies and patient was prepped and draped in usual sterile fashion Exploration:    Imaging obtained: x-ray     Imaging outcome: foreign body not noted     Wound exploration: wound explored through full range of motion     Contaminated: yes   Treatment:    Area cleansed with:  Povidone-iodine and chlorhexidine   Amount of cleaning:  Standard   Debridement:  None   Undermining:  None   Scar revision: no   Skin repair:    Repair method:  Sutures   Suture size:  5-0   Suture material:   Prolene   Suture technique:  Simple interrupted   Number of sutures:  2 Approximation:    Approximation:  Loose  Repair type:    Repair type:  Simple Post-procedure details:    Dressing:  Bulky dressing   Procedure completion:  Tolerated well, no immediate complications  (including critical care time)  Medications Ordered in UC Medications - No data to display  Initial Impression / Assessment and Plan / UC Course  I have reviewed the triage vital signs and the nursing notes.  Pertinent labs & imaging results that were available during my care of the patient were reviewed by me and considered in my medical decision making (see chart for details).     X-ray negative for any foreign body or fracture.  Laceration repair performed as documented under procedure as above.  Wound care performed and a dressing applied.  Tdap booster ordered.  Started on Keflex for infection prevention.  Discussed proper wound care at home.  Discussed return precautions. Final Clinical Impressions(s) / UC Diagnoses   Final diagnoses:  Injury of left thumb, initial encounter  Laceration of left thumb without foreign body with damage to nail, initial encounter     Discharge Instructions      Start taking Keflex twice daily for 7 days for infection prevention. Keep the area clean, dry, and covered. Avoid getting the area wet within the first 24 hours. You can apply bacitracin or Neosporin to the area for further infection prevention. Return here in 10 to 14 days for suture removal or sooner if you develop worsening pain, redness, swelling, or pus drainage from the area.     ED Prescriptions     Medication Sig Dispense Auth. Provider   cephALEXin (KEFLEX) 500 MG capsule Take 1 capsule (500 mg total) by mouth 2 (two) times daily for 7 days. 14 capsule Levora Reas A, NP      PDMP not reviewed this encounter.   Karon Packer, NP 07/30/23 1854    Karon Packer, NP 07/30/23  1856    Levora Reas A, NP 07/31/23 1020

## 2023-10-12 DIAGNOSIS — Z794 Long term (current) use of insulin: Secondary | ICD-10-CM | POA: Diagnosis not present

## 2023-10-12 DIAGNOSIS — E1065 Type 1 diabetes mellitus with hyperglycemia: Secondary | ICD-10-CM | POA: Diagnosis not present

## 2023-10-12 DIAGNOSIS — E785 Hyperlipidemia, unspecified: Secondary | ICD-10-CM | POA: Diagnosis not present

## 2024-01-12 DIAGNOSIS — Z794 Long term (current) use of insulin: Secondary | ICD-10-CM | POA: Diagnosis not present

## 2024-01-12 DIAGNOSIS — E1065 Type 1 diabetes mellitus with hyperglycemia: Secondary | ICD-10-CM | POA: Diagnosis not present

## 2024-01-12 DIAGNOSIS — E785 Hyperlipidemia, unspecified: Secondary | ICD-10-CM | POA: Diagnosis not present

## 2024-04-16 DIAGNOSIS — E1065 Type 1 diabetes mellitus with hyperglycemia: Secondary | ICD-10-CM | POA: Diagnosis not present

## 2024-04-16 DIAGNOSIS — E785 Hyperlipidemia, unspecified: Secondary | ICD-10-CM | POA: Diagnosis not present

## 2024-04-16 DIAGNOSIS — Z794 Long term (current) use of insulin: Secondary | ICD-10-CM | POA: Diagnosis not present

## 2024-05-31 ENCOUNTER — Encounter: Admitting: Primary Care
